# Patient Record
Sex: Male | Born: 1955 | Race: White | Hispanic: No | Marital: Married | Smoking: Never smoker
Health system: Southern US, Community
[De-identification: ages and names within clinical notes are randomized; demographics above are authoritative.]

## PROBLEM LIST (undated history)

## (undated) DIAGNOSIS — E119 Type 2 diabetes mellitus without complications: Secondary | ICD-10-CM

## (undated) DIAGNOSIS — I10 Essential (primary) hypertension: Secondary | ICD-10-CM

## (undated) HISTORY — PX: CHOLECYSTECTOMY: SHX55

## (undated) HISTORY — PX: TONSILLECTOMY: SUR1361

## (undated) HISTORY — PX: HERNIA REPAIR: SHX51

---

## 2015-11-02 DIAGNOSIS — M2042 Other hammer toe(s) (acquired), left foot: Secondary | ICD-10-CM | POA: Insufficient documentation

## 2015-11-02 DIAGNOSIS — M7742 Metatarsalgia, left foot: Secondary | ICD-10-CM | POA: Insufficient documentation

## 2015-11-02 DIAGNOSIS — E11610 Type 2 diabetes mellitus with diabetic neuropathic arthropathy: Secondary | ICD-10-CM | POA: Insufficient documentation

## 2015-11-15 DIAGNOSIS — M65872 Other synovitis and tenosynovitis, left ankle and foot: Secondary | ICD-10-CM | POA: Insufficient documentation

## 2016-01-05 DIAGNOSIS — T85848A Pain due to other internal prosthetic devices, implants and grafts, initial encounter: Secondary | ICD-10-CM | POA: Insufficient documentation

## 2016-05-13 DIAGNOSIS — E11610 Type 2 diabetes mellitus with diabetic neuropathic arthropathy: Secondary | ICD-10-CM | POA: Insufficient documentation

## 2017-06-07 ENCOUNTER — Emergency Department
Admission: EM | Admit: 2017-06-07 | Discharge: 2017-06-07 | Disposition: A | Discharge status: Home / Self Care | Payer: BLUE CROSS/BLUE SHIELD | Attending: Emergency Medicine | Admitting: Emergency Medicine

## 2017-06-07 ENCOUNTER — Encounter: Payer: Self-pay | Admitting: Emergency Medicine

## 2017-06-07 DIAGNOSIS — R112 Nausea with vomiting, unspecified: Secondary | ICD-10-CM | POA: Diagnosis present

## 2017-06-07 DIAGNOSIS — K2970 Gastritis, unspecified, without bleeding: Secondary | ICD-10-CM | POA: Diagnosis not present

## 2017-06-07 DIAGNOSIS — Z794 Long term (current) use of insulin: Secondary | ICD-10-CM | POA: Diagnosis not present

## 2017-06-07 DIAGNOSIS — Z79899 Other long term (current) drug therapy: Secondary | ICD-10-CM | POA: Diagnosis not present

## 2017-06-07 DIAGNOSIS — E119 Type 2 diabetes mellitus without complications: Secondary | ICD-10-CM | POA: Insufficient documentation

## 2017-06-07 HISTORY — DX: Essential (primary) hypertension: I10

## 2017-06-07 HISTORY — DX: Type 2 diabetes mellitus without complications: E11.9

## 2017-06-07 LAB — COMPREHENSIVE METABOLIC PANEL
ALT: 17 U/L (ref 17–63)
AST: 20 U/L (ref 15–41)
Albumin: 4 g/dL (ref 3.5–5.0)
Alkaline Phosphatase: 49 U/L (ref 38–126)
Anion gap: 10 (ref 5–15)
BUN: 29 mg/dL — AB (ref 6–20)
CHLORIDE: 105 mmol/L (ref 101–111)
CO2: 26 mmol/L (ref 22–32)
CREATININE: 0.9 mg/dL (ref 0.61–1.24)
Calcium: 9.4 mg/dL (ref 8.9–10.3)
GFR calc Af Amer: >60 mL/min (ref >60)
GFR calc non Af Amer: >60 mL/min (ref >60)
Glucose, Bld: 279 mg/dL — ABNORMAL HIGH (ref 65–99)
POTASSIUM: 4 mmol/L (ref 3.5–5.1)
SODIUM: 141 mmol/L (ref 135–145)
Total Bilirubin: 1 mg/dL (ref 0.3–1.2)
Total Protein: 7.6 g/dL (ref 6.5–8.1)

## 2017-06-07 LAB — CBC
HCT: 38.6 % — ABNORMAL LOW (ref 40.0–52.0)
Hemoglobin: 13.1 g/dL (ref 13.0–18.0)
MCH: 31.1 pg (ref 26.0–34.0)
MCHC: 34.1 g/dL (ref 32.0–36.0)
MCV: 91.3 fL (ref 80.0–100.0)
Platelets: 318 K/uL (ref 150–440)
RBC: 4.23 MIL/uL — ABNORMAL LOW (ref 4.40–5.90)
RDW: 13.9 % (ref 11.5–14.5)
WBC: 14.9 K/uL — ABNORMAL HIGH (ref 3.8–10.6)

## 2017-06-07 LAB — LIPASE, BLOOD: Lipase: 26 U/L (ref 11–51)

## 2017-06-07 MED ORDER — SODIUM CHLORIDE 0.9 % IV SOLN
1000.0000 mL | Freq: Once | INTRAVENOUS | Status: AC
  Administered 2017-06-07: 1000 mL via INTRAVENOUS

## 2017-06-07 MED ORDER — MORPHINE SULFATE (PF) 2 MG/ML IV SOLN
2.0000 mg | Freq: Once | INTRAVENOUS | Status: AC
  Administered 2017-06-07: 2 mg via INTRAVENOUS
  Filled 2017-06-07: qty 1

## 2017-06-07 MED ORDER — SODIUM CHLORIDE 0.9 % IV BOLUS (SEPSIS)
1000.0000 mL | Freq: Once | INTRAVENOUS | Status: AC
  Administered 2017-06-07: 1000 mL via INTRAVENOUS

## 2017-06-07 MED ORDER — ONDANSETRON 4 MG PO TBDP: 4.0000 mg | ORAL_TABLET | Freq: Three times a day (TID) | ORAL | 0 refills | Status: DC | PRN

## 2017-06-07 MED ORDER — ONDANSETRON HCL 4 MG/2ML IJ SOLN
4.0000 mg | Freq: Once | INTRAMUSCULAR | Status: AC
  Administered 2017-06-07: 4 mg via INTRAVENOUS
  Filled 2017-06-07: qty 2

## 2017-06-07 MED ORDER — ONDANSETRON HCL 4 MG/2ML IJ SOLN
4.0000 mg | Freq: Once | INTRAMUSCULAR | Status: AC | PRN
  Administered 2017-06-07: 4 mg via INTRAVENOUS
  Filled 2017-06-07: qty 2

## 2017-06-07 NOTE — ED Provider Notes (Signed)
[facility] Emergency Department Provider Note   ____________________________________________    I have reviewed the triage vital signs and the nursing notes.   HISTORY  Chief Complaint Emesis     HPI Randy Nelson is a 61 y.o. male who presents with nausea and vomiting. Patient reports he awoke at 1 AM this morning with upper abdominal cramping followed by nausea and vomiting. He denies diarrhea. No sick contacts reported. Known biliary non-bloody vomitus. No fevers or chills reported. No recent travel. Describes mild to moderate cramping discomfort in the upper abdomen. No history of abdominal surgery   Past Medical History:  Diagnosis Date  . Diabetes mellitus without complication (HCC)   . Hypertension     There are no active problems to display for this patient.   History reviewed. No pertinent surgical history.  Prior to Admission medications   Medication Sig Start Date End Date Taking? Authorizing Provider  amLODipine (NORVASC) 5 MG tablet Take 5 mg by mouth daily. 03/05/17  Yes [provider]  atorvastatin (LIPITOR) 10 MG tablet Take 10 mg by mouth daily. 04/14/17  Yes [provider]  chlorthalidone (HYGROTON) 25 MG tablet Take 25 mg by mouth daily. 06/03/17  Yes [provider]  HUMALOG KWIKPEN 200 UNIT/ML SOPN Inject 5-30 Units into the skin 3 (three) times daily with meals as needed. 03/31/17  Yes [provider]  LEVEMIR FLEXTOUCH 100 UNIT/ML Pen Inject 50 Units into the skin 2 (two) times daily. 05/28/17  Yes [provider]  lisinopril (PRINIVIL,ZESTRIL) 40 MG tablet Take 40 mg by mouth daily. 04/07/17  Yes [provider]  metFORMIN (GLUCOPHAGE) 500 MG tablet Take 1,000 mg by mouth 2 (two) times daily. 04/14/17  Yes [provider]  ondansetron (ZOFRAN ODT) 4 MG disintegrating tablet Take 1 tablet (4 mg total) by mouth every 8 (eight) hours as needed for nausea or  vomiting. 06/07/17   Jene EveryKinner, Catcher Dehoyos, MD     Allergies Sulfa antibiotics  History reviewed. No pertinent family history.  Social History Social History  Substance Use Topics  . Smoking status: Never Smoker  . Smokeless tobacco: Never Used  . Alcohol use No    Review of Systems  Constitutional: No fever/chills Eyes: No visual changes.  ENT: No sore throat. Cardiovascular: Denies chest pain. Respiratory: Denies shortness of breath. Gastrointestinal: As above Genitourinary: Negative for dysuria. Musculoskeletal: Negative for back pain. Skin: Negative for rash. Neurological: Negative for headaches    ____________________________________________   PHYSICAL EXAM:  VITAL SIGNS: ED Triage Vitals [06/07/17 0539]  Enc Vitals Group     BP (!) 172/88     Pulse Rate 63     Resp 16     Temp 97.7 F (36.5 C)     Temp Source Oral     SpO2 98 %     Weight 111.1 kg (245 lb)     Height 1.905 m (6\' 3" )     Head Circumference      Peak Flow      Pain Score      Pain Loc      Pain Edu?      Excl. in GC?     Constitutional: Alert and oriented. No acute distress. Pleasant and interactive Eyes: Conjunctivae are normal.   Nose: No congestion/rhinnorhea. Mouth/Throat: Mucous membranes are moist.    Cardiovascular: Normal rate, regular rhythm. Grossly normal heart sounds.  Good peripheral circulation. Respiratory: Normal respiratory effort.  No retractions. Lungs CTAB.  Gastrointestinal: Soft and nontender. No distention.  No CVA tenderness. Genitourinary: deferred Musculoskeletal: No lower extremity tenderness nor edema.  Warm and well perfused Neurologic:  Normal speech and language. No gross focal neurologic deficits are appreciated.  Skin:  Skin is warm, dry and intact. No rash noted. Psychiatric: Mood and affect are normal. Speech and behavior are normal.  ____________________________________________   LABS (all labs ordered are listed, but only abnormal results are  displayed)  Labs Reviewed  COMPREHENSIVE METABOLIC PANEL - Abnormal; Notable for the following:       Result Value   Glucose, Bld 279 (*)    BUN 29 (*)    All other components within normal limits  CBC - Abnormal; Notable for the following:    WBC 14.9 (*)    RBC 4.23 (*)    HCT 38.6 (*)    All other components within normal limits  LIPASE, BLOOD  URINALYSIS, COMPLETE (UACMP) WITH MICROSCOPIC  CBG MONITORING, ED   ____________________________________________  EKG  ED ECG REPORT I, Jene Every, the attending physician, personally viewed and interpreted this ECG.  Date: 06/07/2017  Rhythm: Sinus arrhythmia QRS Axis: normal Intervals: normal ST/T Wave abnormalities: normal Narrative Interpretation: no evidence of acute ischemia  ____________________________________________  RADIOLOGY  None ____________________________________________   PROCEDURES  Procedure(s) performed: No    Critical Care performed: No ____________________________________________   INITIAL IMPRESSION / ASSESSMENT AND PLAN / ED COURSE  Pertinent labs & imaging results that were available during my care of the patient were reviewed by me and considered in my medical decision making (see chart for details).  Patient well-appearing and in no acute distress. No significant tenderness palpation on exam. Labs significant only for elevated white blood cell, I suspect this is related to viral gastritis that is prevalent in the community at this time. We will treat with IV Zofran and IV fluids and reevaluate.  Patient had brief episode of chest discomfort after morphine admin. EKG unremarkable  Patient has tolerated PO challenge and reports feeling better.   I will discharge with zofran. Return precautions discussed.    ____________________________________________   FINAL CLINICAL IMPRESSION(S) / ED DIAGNOSES  Final diagnoses:  Viral gastritis      NEW MEDICATIONS STARTED DURING THIS  VISIT:  Discharge Medication List as of 06/07/2017 10:41 AM    START taking these medications   Details  ondansetron (ZOFRAN ODT) 4 MG disintegrating tablet Take 1 tablet (4 mg total) by mouth every 8 (eight) hours as needed for nausea or vomiting., Starting Sat 06/07/2017, Print         Note:  This document was prepared using Dragon voice recognition software and may include unintentional dictation errors.    Jene Every, MD 06/07/17 1341

## 2017-06-07 NOTE — ED Notes (Signed)
Report to alicia, rn.  

## 2017-06-07 NOTE — ED Triage Notes (Signed)
Pt c/o N/V and stomach cramping since 0100 this AM. Pt has hx of DM, CBG 252 in triage.

## 2017-06-07 NOTE — ED Notes (Signed)

## 2017-06-07 NOTE — ED Notes (Signed)
States chest pian 5/10 for past 10 minutes. EKG done and to MD.

## 2017-06-07 NOTE — ED Notes (Signed)
Pt states awoke at 0100 with bilateral lower abd pain and cramping followed by nausea and vomiting. Pt states last bowel movement during emesis, but no diarrhea. Pt states he and his spouse consumed pork chops pm prior to onset of symptoms, however spouse is not ill. Pt denies known fever. Skin normal color, warm and dry.

## 2017-06-10 ENCOUNTER — Inpatient Hospital Stay
Admission: EM | Admit: 2017-06-10 | Discharge: 2017-06-15 | DRG: 871 | Disposition: A | Discharge status: Other Acute Inpatient Hospital | Payer: BLUE CROSS/BLUE SHIELD | Attending: Internal Medicine | Admitting: Internal Medicine

## 2017-06-10 ENCOUNTER — Emergency Department: Payer: BLUE CROSS/BLUE SHIELD

## 2017-06-10 DIAGNOSIS — K819 Cholecystitis, unspecified: Secondary | ICD-10-CM | POA: Diagnosis present

## 2017-06-10 DIAGNOSIS — K8050 Calculus of bile duct without cholangitis or cholecystitis without obstruction: Secondary | ICD-10-CM | POA: Diagnosis not present

## 2017-06-10 DIAGNOSIS — R7881 Bacteremia: Secondary | ICD-10-CM

## 2017-06-10 DIAGNOSIS — Z794 Long term (current) use of insulin: Secondary | ICD-10-CM

## 2017-06-10 DIAGNOSIS — K859 Acute pancreatitis without necrosis or infection, unspecified: Secondary | ICD-10-CM | POA: Diagnosis not present

## 2017-06-10 DIAGNOSIS — Z7982 Long term (current) use of aspirin: Secondary | ICD-10-CM

## 2017-06-10 DIAGNOSIS — R17 Unspecified jaundice: Secondary | ICD-10-CM

## 2017-06-10 DIAGNOSIS — E669 Obesity, unspecified: Secondary | ICD-10-CM | POA: Diagnosis present

## 2017-06-10 DIAGNOSIS — I248 Other forms of acute ischemic heart disease: Secondary | ICD-10-CM | POA: Diagnosis present

## 2017-06-10 DIAGNOSIS — Z79899 Other long term (current) drug therapy: Secondary | ICD-10-CM | POA: Diagnosis not present

## 2017-06-10 DIAGNOSIS — R1010 Upper abdominal pain, unspecified: Secondary | ICD-10-CM

## 2017-06-10 DIAGNOSIS — E1122 Type 2 diabetes mellitus with diabetic chronic kidney disease: Secondary | ICD-10-CM | POA: Diagnosis present

## 2017-06-10 DIAGNOSIS — R740 Nonspecific elevation of levels of transaminase and lactic acid dehydrogenase [LDH]: Secondary | ICD-10-CM | POA: Diagnosis not present

## 2017-06-10 DIAGNOSIS — E876 Hypokalemia: Secondary | ICD-10-CM | POA: Diagnosis not present

## 2017-06-10 DIAGNOSIS — N183 Chronic kidney disease, stage 3 (moderate): Secondary | ICD-10-CM | POA: Diagnosis present

## 2017-06-10 DIAGNOSIS — A4159 Other Gram-negative sepsis: Principal | ICD-10-CM | POA: Diagnosis present

## 2017-06-10 DIAGNOSIS — A419 Sepsis, unspecified organism: Secondary | ICD-10-CM

## 2017-06-10 DIAGNOSIS — K567 Ileus, unspecified: Secondary | ICD-10-CM | POA: Diagnosis not present

## 2017-06-10 DIAGNOSIS — E1165 Type 2 diabetes mellitus with hyperglycemia: Secondary | ICD-10-CM | POA: Diagnosis present

## 2017-06-10 DIAGNOSIS — Z6830 Body mass index (BMI) 30.0-30.9, adult: Secondary | ICD-10-CM

## 2017-06-10 DIAGNOSIS — I129 Hypertensive chronic kidney disease with stage 1 through stage 4 chronic kidney disease, or unspecified chronic kidney disease: Secondary | ICD-10-CM | POA: Diagnosis present

## 2017-06-10 DIAGNOSIS — K8063 Calculus of gallbladder and bile duct with acute cholecystitis with obstruction: Secondary | ICD-10-CM | POA: Diagnosis present

## 2017-06-10 DIAGNOSIS — N179 Acute kidney failure, unspecified: Secondary | ICD-10-CM | POA: Diagnosis present

## 2017-06-10 DIAGNOSIS — K8031 Calculus of bile duct with cholangitis, unspecified, with obstruction: Secondary | ICD-10-CM

## 2017-06-10 DIAGNOSIS — R112 Nausea with vomiting, unspecified: Secondary | ICD-10-CM

## 2017-06-10 DIAGNOSIS — R7401 Elevation of levels of liver transaminase levels: Secondary | ICD-10-CM

## 2017-06-10 LAB — TROPONIN I
Troponin I: 0.06 ng/mL (ref <0.03)
Troponin I: 0.23 ng/mL (ref <0.03)

## 2017-06-10 LAB — URINALYSIS, COMPLETE (UACMP) WITH MICROSCOPIC
Bilirubin Urine: NEGATIVE
Glucose, UA: 50 mg/dL — AB
Ketones, ur: NEGATIVE mg/dL
Leukocytes, UA: NEGATIVE
NITRITE: NEGATIVE
PH: 5 (ref 5.0–8.0)
Protein, ur: 100 mg/dL — AB
SPECIFIC GRAVITY, URINE: 1.026 (ref 1.005–1.030)

## 2017-06-10 LAB — COMPREHENSIVE METABOLIC PANEL
ALBUMIN: 2.9 g/dL — AB (ref 3.5–5.0)
ALK PHOS: 362 U/L — AB (ref 38–126)
ALT: 97 U/L — ABNORMAL HIGH (ref 17–63)
ANION GAP: 13 (ref 5–15)
AST: 228 U/L — ABNORMAL HIGH (ref 15–41)
BILIRUBIN TOTAL: 3.4 mg/dL — AB (ref 0.3–1.2)
BUN: 23 mg/dL — ABNORMAL HIGH (ref 6–20)
CALCIUM: 8.3 mg/dL — AB (ref 8.9–10.3)
CO2: 23 mmol/L (ref 22–32)
Chloride: 100 mmol/L — ABNORMAL LOW (ref 101–111)
Creatinine, Ser: 1.13 mg/dL (ref 0.61–1.24)
GFR calc Af Amer: >60 mL/min (ref >60)
GLUCOSE: 221 mg/dL — AB (ref 65–99)
Potassium: 3.1 mmol/L — ABNORMAL LOW (ref 3.5–5.1)
Sodium: 136 mmol/L (ref 135–145)
TOTAL PROTEIN: 6.9 g/dL (ref 6.5–8.1)

## 2017-06-10 LAB — CBC WITH DIFFERENTIAL/PLATELET
BASOS ABS: 0 10*3/uL (ref 0–0.1)
BASOS PCT: 0 %
EOS PCT: 0 %
Eosinophils Absolute: 0 10*3/uL (ref 0–0.7)
HEMATOCRIT: 34 % — AB (ref 40.0–52.0)
Hemoglobin: 11.7 g/dL — ABNORMAL LOW (ref 13.0–18.0)
Lymphocytes Relative: 2 %
Lymphs Abs: 0.3 10*3/uL — ABNORMAL LOW (ref 1.0–3.6)
MCH: 30.8 pg (ref 26.0–34.0)
MCHC: 34.5 g/dL (ref 32.0–36.0)
MCV: 89.1 fL (ref 80.0–100.0)
MONO ABS: 0.3 10*3/uL (ref 0.2–1.0)
Monocytes Relative: 3 %
NEUTROS ABS: 11.6 10*3/uL — AB (ref 1.4–6.5)
Neutrophils Relative %: 95 %
PLATELETS: 310 10*3/uL (ref 150–440)
RBC: 3.81 MIL/uL — ABNORMAL LOW (ref 4.40–5.90)
RDW: 13.6 % (ref 11.5–14.5)
WBC: 12.2 10*3/uL — ABNORMAL HIGH (ref 3.8–10.6)

## 2017-06-10 LAB — LACTIC ACID, PLASMA: Lactic Acid, Venous: 1.8 mmol/L (ref 0.5–1.9)

## 2017-06-10 LAB — GLUCOSE, CAPILLARY: GLUCOSE-CAPILLARY: 274 mg/dL — AB (ref 65–99)

## 2017-06-10 MED ORDER — ACETAMINOPHEN 325 MG PO TABS
650.0000 mg | ORAL_TABLET | Freq: Four times a day (QID) | ORAL | Status: DC | PRN
  Administered 2017-06-10 – 2017-06-12 (×5): 650 mg via ORAL
  Filled 2017-06-10 (×5): qty 2

## 2017-06-10 MED ORDER — ONDANSETRON HCL 4 MG/2ML IJ SOLN
4.0000 mg | Freq: Four times a day (QID) | INTRAMUSCULAR | Status: DC | PRN
  Administered 2017-06-12 – 2017-06-15 (×3): 4 mg via INTRAVENOUS
  Filled 2017-06-10 (×3): qty 2

## 2017-06-10 MED ORDER — SODIUM CHLORIDE 0.9 % IV BOLUS (SEPSIS): 1000.0000 mL | Freq: Once | INTRAVENOUS | Status: DC

## 2017-06-10 MED ORDER — SODIUM CHLORIDE 0.9 % IV BOLUS (SEPSIS)
1000.0000 mL | Freq: Once | INTRAVENOUS | Status: AC
  Administered 2017-06-10: 1000 mL via INTRAVENOUS

## 2017-06-10 MED ORDER — ONDANSETRON HCL 4 MG/2ML IJ SOLN
4.0000 mg | Freq: Once | INTRAMUSCULAR | Status: AC
  Administered 2017-06-10: 4 mg via INTRAVENOUS
  Filled 2017-06-10: qty 2

## 2017-06-10 MED ORDER — PIPERACILLIN-TAZOBACTAM 3.375 G IVPB
3.3750 g | Freq: Three times a day (TID) | INTRAVENOUS | Status: DC
  Administered 2017-06-11 – 2017-06-13 (×8): 3.375 g via INTRAVENOUS
  Filled 2017-06-10 (×11): qty 50

## 2017-06-10 MED ORDER — ALBUTEROL SULFATE (2.5 MG/3ML) 0.083% IN NEBU: 2.5000 mg | INHALATION_SOLUTION | RESPIRATORY_TRACT | Status: DC | PRN

## 2017-06-10 MED ORDER — ENOXAPARIN SODIUM 40 MG/0.4ML ~~LOC~~ SOLN
40.0000 mg | SUBCUTANEOUS | Status: DC
  Administered 2017-06-10: 40 mg via SUBCUTANEOUS
  Filled 2017-06-10: qty 0.4

## 2017-06-10 MED ORDER — PIPERACILLIN-TAZOBACTAM 3.375 G IVPB 30 MIN
3.3750 g | Freq: Once | INTRAVENOUS | Status: AC
  Administered 2017-06-10: 3.375 g via INTRAVENOUS
  Filled 2017-06-10: qty 50

## 2017-06-10 MED ORDER — INSULIN ASPART 100 UNIT/ML ~~LOC~~ SOLN
0.0000 [IU] | SUBCUTANEOUS | Status: DC
  Administered 2017-06-10 – 2017-06-11 (×2): 8 [IU] via SUBCUTANEOUS
  Administered 2017-06-11 (×4): 3 [IU] via SUBCUTANEOUS
  Filled 2017-06-10 (×6): qty 1

## 2017-06-10 MED ORDER — SODIUM CHLORIDE 0.9% FLUSH
3.0000 mL | Freq: Two times a day (BID) | INTRAVENOUS | Status: DC
  Administered 2017-06-11 – 2017-06-15 (×6): 3 mL via INTRAVENOUS

## 2017-06-10 MED ORDER — OXYCODONE HCL 5 MG PO TABS
5.0000 mg | ORAL_TABLET | ORAL | Status: DC | PRN
  Administered 2017-06-12 – 2017-06-15 (×6): 5 mg via ORAL
  Filled 2017-06-10 (×6): qty 1

## 2017-06-10 MED ORDER — IOPAMIDOL (ISOVUE-300) INJECTION 61%
30.0000 mL | Freq: Once | INTRAVENOUS | Status: AC | PRN
  Administered 2017-06-10: 30 mL via ORAL

## 2017-06-10 MED ORDER — INSULIN DETEMIR 100 UNIT/ML ~~LOC~~ SOLN
15.0000 [IU] | Freq: Two times a day (BID) | SUBCUTANEOUS | Status: DC
  Administered 2017-06-10 – 2017-06-11 (×2): 15 [IU] via SUBCUTANEOUS
  Filled 2017-06-10 (×3): qty 0.15

## 2017-06-10 MED ORDER — MORPHINE SULFATE (PF) 2 MG/ML IV SOLN: 2.0000 mg | INTRAVENOUS | Status: DC | PRN

## 2017-06-10 MED ORDER — POLYETHYLENE GLYCOL 3350 17 G PO PACK: 17.0000 g | PACK | Freq: Every day | ORAL | Status: DC | PRN

## 2017-06-10 MED ORDER — POTASSIUM CHLORIDE IN NACL 20-0.9 MEQ/L-% IV SOLN
INTRAVENOUS | Status: DC
  Administered 2017-06-10 – 2017-06-14 (×9): via INTRAVENOUS
  Filled 2017-06-10 (×11): qty 1000

## 2017-06-10 MED ORDER — ONDANSETRON HCL 4 MG PO TABS
4.0000 mg | ORAL_TABLET | Freq: Four times a day (QID) | ORAL | Status: DC | PRN
  Administered 2017-06-13: 4 mg via ORAL
  Filled 2017-06-10: qty 1

## 2017-06-10 MED ORDER — ACETAMINOPHEN 650 MG RE SUPP: 650.0000 mg | Freq: Four times a day (QID) | RECTAL | Status: DC | PRN

## 2017-06-10 NOTE — ED Notes (Signed)
Troponin of 0.06 reported to Dr Pershing ProudSchaevitz - no new orders at this time

## 2017-06-10 NOTE — Consult Note (Signed)
Patient ID: Randy Nelson, male   DOB: [birth year], 61 y.o.   MRN: [id number]  CC: nausea and vomiting  HPI Randy Nelson is a 61 y.o. male who presents to the emergency department with complaints of nausea, vomiting, fevers. Surgery consultation was requested by Dr.Shaevitz due to concerns for cholecystitis. Patient had been seen in the ER 4 days ago with similar complaints of nausea, vomiting, fevers. He was diagnosed with a viral gastritis and discharged home. He reports since that time his symptoms have persisted and worsened today prompting him to return to the emergency room. He's had a fever as high as 102F and has had persistent chills and also reports having some shortness of breath. He denies any chest pain, diarrhea, constipation, dysuria. Initially he reported no abdominal pain. He's never had any symptoms like this before. Nothing appears to making a better or worse.  HPI  Past Medical History:  Diagnosis Date  . Diabetes mellitus without complication (HCC)   . Hypertension     Past surgical history: Umbilical hernia repair, tonsillectomy, foot surgery.  Family History  Problem Relation Age of Onset  . Diabetes Father   . Diabetes Brother   Brother also with a collagen disease, mother with cancer of unknown type.  Social History Social History  Substance Use Topics  . Smoking status: Never Smoker  . Smokeless tobacco: Never Used  . Alcohol use No    Allergies  Allergen Reactions  . Sulfa Antibiotics Other (See Comments)    Family history of allergy, mostly rashes. No anaphylaxis reported.    Current Facility-Administered Medications  Medication Dose Route Frequency Provider Last Rate Last Dose  . 0.9 % NaCl with KCl 20 mEq/ L  infusion   Intravenous Continuous Sudini, Srikar, MD      . acetaminophen (TYLENOL) tablet 650 mg  650 mg Oral Q6H PRN Milagros Loll, MD       Or  . acetaminophen (TYLENOL) suppository 650 mg  650 mg Rectal Q6H PRN Sudini,  Srikar, MD      . albuterol (PROVENTIL) (2.5 MG/3ML) 0.083% nebulizer solution 2.5 mg  2.5 mg Nebulization Q2H PRN Sudini, Srikar, MD      . enoxaparin (LOVENOX) injection 40 mg  40 mg Subcutaneous Q24H Sudini, Srikar, MD      . insulin aspart (novoLOG) injection 0-15 Units  0-15 Units Subcutaneous Q4H Sudini, Srikar, MD      . insulin detemir (LEVEMIR) injection 15 Units  15 Units Subcutaneous BID Sudini, Srikar, MD      . ondansetron (ZOFRAN) tablet 4 mg  4 mg Oral Q6H PRN Milagros Loll, MD       Or  . ondansetron (ZOFRAN) injection 4 mg  4 mg Intravenous Q6H PRN Sudini, Srikar, MD      . oxyCODONE (Oxy IR/ROXICODONE) immediate release tablet 5 mg  5 mg Oral Q4H PRN Milagros Loll, MD      . Melene Muller ON 06/11/2017] piperacillin-tazobactam (ZOSYN) IVPB 3.375 g  3.375 g Intravenous Q8H Coffee, Gerre Pebbles, RPH      . polyethylene glycol (MIRALAX / GLYCOLAX) packet 17 g  17 g Oral Daily PRN Sudini, Srikar, MD      . sodium chloride 0.9 % bolus 1,000 mL  1,000 mL Intravenous Once Sudini, Srikar, MD      . sodium chloride flush (NS) 0.9 % injection 3 mL  3 mL Intravenous Q12H Milagros Loll, MD       Current Outpatient Prescriptions  Medication Sig Dispense Refill  .  amLODipine (NORVASC) 5 MG tablet Take 5 mg by mouth daily.    Marland Kitchen aspirin EC 81 MG tablet Take 81 mg by mouth daily.    Marland Kitchen atorvastatin (LIPITOR) 10 MG tablet Take 10 mg by mouth daily.    . chlorthalidone (HYGROTON) 25 MG tablet Take 25 mg by mouth daily.    Marland Kitchen LEVEMIR FLEXTOUCH 100 UNIT/ML Pen Inject 50 Units into the skin 2 (two) times daily.    Marland Kitchen lisinopril (PRINIVIL,ZESTRIL) 40 MG tablet Take 40 mg by mouth daily.    . metFORMIN (GLUCOPHAGE) 500 MG tablet Take 1,000 mg by mouth 2 (two) times daily.    Marland Kitchen HUMALOG KWIKPEN 200 UNIT/ML SOPN Inject 5-30 Units into the skin 3 (three) times daily with meals as needed.    . ondansetron (ZOFRAN ODT) 4 MG disintegrating tablet Take 1 tablet (4 mg total) by mouth every 8 (eight) hours as needed for  nausea or vomiting. 20 tablet 0     Review of Systems A Multi-point review of systems was asked and was negative except for the findings documented in the history of present illness  Physical Exam Blood pressure (!) 119/48, pulse 79, temperature (!) 102.4 F (39.1 C), temperature source Oral, resp. rate 17, height 6\' 3"  (1.905 m), weight 111.1 kg (245 lb), SpO2 96 %. CONSTITUTIONAL: Resting in bed in no acute distress. EYES: Pupils are equal, round, and reactive to light, Sclera appeared be jaundiced. EARS, NOSE, MOUTH AND THROAT: The oropharynx is clear. The oral mucosa is pink and moist. Hearing is intact to voice. LYMPH NODES:  Lymph nodes in the neck are normal. RESPIRATORY:  Lungs are clear. There is normal respiratory effort, with equal breath sounds bilaterally, and without pathologic use of accessory muscles. CARDIOVASCULAR: Heart is regular without murmurs, gallops, or rubs. GI: The abdomen is large, soft, minimally tender to deep palpation in the right upper quadrant with a negative Murphy sign, and nondistended. There are no palpable masses. There is no hepatosplenomegaly. There are normal bowel sounds in all quadrants. GU: Rectal deferred.   MUSCULOSKELETAL: Normal muscle strength and tone. No cyanosis or edema.   SKIN: Turgor is good and there are no pathologic skin lesions or ulcers. NEUROLOGIC: Motor and sensation is grossly normal. Cranial nerves are grossly intact. PSYCH:  Oriented to person, place and time. Affect is normal.  Data Reviewed Images and labs reviewed, there is a mild leukocytosis of 12.2 down from 14.9 four days ago. Elevated bilirubin of 3.4 that was normal on previous encounter. Elevated alkaline phosphatase to 362, AST of 228, ALT of 97. Bacteria seen on urinalysis but no other laboratory signs of urinary tract infection. Ultrasound the right upper quadrant shows a distended gallbladder with evidence of cholelithiasis but no pericholecystic fluid, ductal  dilatation, sonographic Murphy sign. Patient also has an elevated troponin to 0.6. Chest x-ray shows possible evidence of low-grade CHF with central pulmonary vascular prominence. I have personally reviewed the patient's imaging, laboratory findings and medical records.    Assessment     Likely cholecystitis    Plan    61 year old male who 3 presents emergency department with fevers chills, nausea, vomiting. This is clearly different from his presentation 4 days ago. Thus far the only positive finding found on workup was of cholelithiasis and given his fever and leukocytosis could potentially represent cholecystitis. However, the history and physical was not 100% consistent with this diagnosis. Regardless, discussed with the patient given his current presentation that he warrants admission to the  hospital for resuscitation and continued workup. Assuming persistent elevation of his bilirubin would recommend an MRCP as there is no ductal dilatation seen on his ultrasound. This case was discussed with the ER provider and the current hospitalist on-call. Current plan will be for patient to the hospitalist services for continued resuscitation and workup of his likely cholecystitis. Discussed timing of surgical intervention with the patient should it ultimately turned out to be cholecystitis. General surgery will follow along during this admission.     Time spent with the patient was 50 minutes, with more than 50% of the time spent in face-to-face education, counseling and care coordination.     Ricarda Frameharles Lucillia Corson, MD FACS General Surgeon 06/10/2017, 8:21 PM

## 2017-06-10 NOTE — ED Provider Notes (Addendum)
[facility] Emergency Department Provider Note  ____________________________________________   First MD Initiated Contact with Patient 06/10/17 1539     (approximate)  I have reviewed the triage vital signs and the nursing notes.   HISTORY  Chief Complaint Fever and Nausea   HPI Randy Nelson is a 61 y.o. male with a history of diabetes and hypertension who is presenting to the emergency department today with upper abdominal pain associated with chills, nausea and vomiting. He denies any diarrhea. He says that the pain is mild at this time and is intermittent. He says that the thing bothering him and this time the most is his inability to sleep as well as right years, repeatedly at home. He was brought in by EMS today and found him to have a temperature of 103.  He denies any known sick contacts. Denies any blood in his vomitus. Also with a decreased appetite. Said that he had chest pain several days ago after morphine administration but has not had any since. EMS also found the patient to be 90% on room air. He does not use home oxygen. He was placed on 2 L of nasal cannula oxygen which resolved his oxygen saturation to 93-94%.Patient denies any cough or runny nose.    Past Medical History:  Diagnosis Date  . Diabetes mellitus without complication (HCC)   . Hypertension     There are no active problems to display for this patient.   History reviewed. No pertinent surgical history.  Prior to Admission medications   Medication Sig Start Date End Date Taking? Authorizing Provider  amLODipine (NORVASC) 5 MG tablet Take 5 mg by mouth daily. 03/05/17  Yes [provider]  aspirin EC 81 MG tablet Take 81 mg by mouth daily.   Yes [provider]  atorvastatin (LIPITOR) 10 MG tablet Take 10 mg by mouth daily. 04/14/17  Yes [provider]  chlorthalidone (HYGROTON) 25 MG tablet Take 25 mg by mouth daily. 06/03/17  Yes [provider]  LEVEMIR FLEXTOUCH 100 UNIT/ML Pen Inject 50 Units into the skin 2 (two) times daily. 05/28/17  Yes [provider]  lisinopril (PRINIVIL,ZESTRIL) 40 MG tablet Take 40 mg by mouth daily. 04/07/17  Yes [provider]  metFORMIN (GLUCOPHAGE) 500 MG tablet Take 1,000 mg by mouth 2 (two) times daily. 04/14/17  Yes [provider]  HUMALOG KWIKPEN 200 UNIT/ML SOPN Inject 5-30 Units into the skin 3 (three) times daily with meals as needed. 03/31/17   [provider]  ondansetron (ZOFRAN ODT) 4 MG disintegrating tablet Take 1 tablet (4 mg total) by mouth every 8 (eight) hours as needed for nausea or vomiting. 06/07/17   Jene Every, MD    Allergies Sulfa antibiotics  No family history on file.  Social History Social History  Substance Use Topics  . Smoking status: Never Smoker  . Smokeless tobacco: Never Used  . Alcohol use No    Review of Systems  Constitutional: fever/chills Eyes: No visual changes. ENT: No sore throat. Cardiovascular: Denies chest pain. Respiratory: Denies shortness of breath. Gastrointestinal: No diarrhea.  No constipation. Genitourinary: Negative for dysuria. Musculoskeletal: Negative for back pain. Skin: Negative for rash. Neurological: Negative for headaches, focal weakness or numbness.   ____________________________________________   PHYSICAL EXAM:  VITAL SIGNS: ED Triage Vitals  Enc Vitals Group     BP 06/10/17 1600 (!) 109/44     Pulse Rate 06/10/17 1600 96     Resp 06/10/17 1600  17     Temp 06/10/17 1600 (!) 102.4 F (39.1 C)     Temp Source 06/10/17 1600 Oral     SpO2 06/10/17 1555 90 %     Weight 06/10/17 1559 245 lb (111.1 kg)     Height 06/10/17 1559 6\' 3"  (1.905 m)     Head Circumference --      Peak Flow --      Pain Score 06/10/17 1558 0     Pain Loc --      Pain Edu? --      Excl. in GC? --     Constitutional: Alert and oriented. Well appearing and in no acute distress. Eyes:  Conjunctivae are normal.  Head: Atraumatic. Nose: No congestion/rhinnorhea. Mouth/Throat: Mucous membranes are moist.  Neck: No stridor.   Cardiovascular: Normal rate, regular rhythm. Grossly normal heart sounds.   Respiratory: Normal respiratory effort.  No retractions. Lungs CTAB. Gastrointestinal: Soft and nontender. No distention. No CVA tenderness. Musculoskeletal: No lower extremity tenderness nor edema.  No joint effusions. Neurologic:  Normal speech and language. No gross focal neurologic deficits are appreciated. Skin:  Skin is warm, dry and intact. No rash noted. Psychiatric: Mood and affect are normal. Speech and behavior are normal.  ____________________________________________   LABS (all labs ordered are listed, but only abnormal results are displayed)  Labs Reviewed  COMPREHENSIVE METABOLIC PANEL - Abnormal; Notable for the following:       Result Value   Potassium 3.1 (*)    Chloride 100 (*)    Glucose, Bld 221 (*)    BUN 23 (*)    Calcium 8.3 (*)    Albumin 2.9 (*)    AST 228 (*)    ALT 97 (*)    Alkaline Phosphatase 362 (*)    Total Bilirubin 3.4 (*)    All other components within normal limits  CBC WITH DIFFERENTIAL/PLATELET - Abnormal; Notable for the following:    WBC 12.2 (*)    RBC 3.81 (*)    Hemoglobin 11.7 (*)    HCT 34.0 (*)    Neutro Abs 11.6 (*)    Lymphs Abs 0.3 (*)    All other components within normal limits  URINALYSIS, COMPLETE (UACMP) WITH MICROSCOPIC - Abnormal; Notable for the following:    Color, Urine AMBER (*)    APPearance CLOUDY (*)    Glucose, UA 50 (*)    Hgb urine dipstick SMALL (*)    Protein, ur 100 (*)    Bacteria, UA FEW (*)    Squamous Epithelial / LPF 0-5 (*)    All other components within normal limits  TROPONIN I - Abnormal; Notable for the following:    Troponin I 0.06 (*)    All other components within normal limits  CULTURE, BLOOD (ROUTINE X 2)  CULTURE, BLOOD (ROUTINE X 2)  URINE CULTURE  LACTIC ACID,  PLASMA   ____________________________________________  EKG  ED ECG REPORT I, Cyana Shook,  Teena Iraniavid M, the attending physician, personally viewed and interpreted this ECG.   Date: 06/10/2017  EKG Time: 1616  Rate: 91  Rhythm: normal sinus rhythm  Axis: Normal  Intervals:none  ST&T Change: No ST segment elevation or depression. No abnormal T-wave inversion.  ____________________________________________  RADIOLOGY  Chest x-ray with mild interstitial prominence suggesting bronchitic change. Possible low-grade CHF as well.  Ultrasound of the abdomen with possible cholecystitis. ____________________________________________   PROCEDURES  Procedure(s) performed:   Procedures  Critical Care performed:   ____________________________________________   INITIAL  IMPRESSION / ASSESSMENT AND PLAN / ED COURSE  Pertinent labs & imaging results that were available during my care of the patient were reviewed by me and considered in my medical decision making (see chart for details).  ----------------------------------------- 7:41 PM on 06/10/2017 -----------------------------------------  Patient evaluated by Dr. Tonita Cong of the surgical service who believes that the patient's labwork is likely secondary to sepsis and the patient does require surgery at this time. He'll be admitted to the medicine service. We will continue his resuscitation. Patient is understanding the plan and willing to comply.      ____________________________________________   FINAL CLINICAL IMPRESSION(S) / ED DIAGNOSES  Sepsis. Abdominal pain. Nausea vomiting. Hyperbilirubinemia. Transaminitis.    NEW MEDICATIONS STARTED DURING THIS VISIT:  New Prescriptions   No medications on file     Note:  This document was prepared using Dragon voice recognition software and may include unintentional dictation errors.     Myrna Blazer, MD 06/10/17 1944    Myrna Blazer, MD 06/10/17  [phone]

## 2017-06-10 NOTE — ED Triage Notes (Signed)
Pt arrived via ems for c/o fever with chills and nausea/vomiting - pt was seen in the ER Sept 1st and since then has not improved - denies shortness of breath - denies chest pain - denies any pain

## 2017-06-10 NOTE — Progress Notes (Signed)
Pharmacy Antibiotic Note  Twanna HyLeslie Irven Schreck is a 61 y.o. male admitted on 06/10/2017 with sepsis.  Pharmacy has been consulted for zosyn dosing.  Plan: Zosyn 3.375g IV q8h (4 hour infusion).  Height: 6\' 3"  (190.5 cm) Weight: 245 lb (111.1 kg) IBW/kg (Calculated) : 84.5  Temp (24hrs), Avg:102.4 F (39.1 C), Min:102.4 F (39.1 C), Max:102.4 F (39.1 C)   Recent Labs Lab 06/07/17 0535 06/10/17 1614  WBC 14.9* 12.2*  CREATININE 0.90 1.13  LATICACIDVEN  --  1.8    Estimated Creatinine Clearance: 90 mL/min (by C-G formula based on SCr of 1.13 mg/dL).    Allergies  Allergen Reactions  . Sulfa Antibiotics Other (See Comments)    Family history of allergy, mostly rashes. No anaphylaxis reported.    Antimicrobials this admission: 9/4 zosyn >>    Microbiology results: 9/4 BCx: Pending x 2  9/4 UCx: Pending   Thank you for allowing pharmacy to be a part of this patient's care.  Gerre PebblesGarrett Kadeshia Kasparian 06/10/2017 6:02 PM

## 2017-06-10 NOTE — ED Notes (Addendum)
Patient transported to x-ray. ?

## 2017-06-10 NOTE — ED Notes (Signed)
CODE SEPSIS CALLED TO D[facility]G AT [facility] 

## 2017-06-10 NOTE — ED Notes (Signed)
Patient transported to US at this time.  

## 2017-06-10 NOTE — Progress Notes (Signed)
Spoke with Dr.Huglemeyer about patient's request  To not do CBG's every 4 hrs, no change in order at this time. Also asked if patient could shower in the am, no new order at this time, Dr.Huglemeyer stated it could be revisited in the am. Patient also requested his 50 units of levemir bid that he takes at home, no new orders at this time due to patient being NPO. Randy Nelson,Randy Nelson M

## 2017-06-10 NOTE — H&P (Addendum)
[facility] - [place] [facility]   PATIENT NAME: Randy Nelson    MR#:  [id number]  DATE OF BIRTH:  08-09-1956  DATE OF ADMISSION:  06/10/2017  PRIMARY CARE PHYSICIAN: Eulogio BearManor, James P., MD   REQUESTING/REFERRING PHYSICIAN: Dr. Langston MaskerShaevitz  CHIEF COMPLAINT:   Chief Complaint  Patient presents with  . Fever  . Nausea    HISTORY OF PRESENT ILLNESS:  Randy ReiningLeslie Langston  is a 61 y.o. male with a known history of Hypertension, diabetes presents to the emergency room with 4 days of nausea, vomiting and abdominal pain. Patient was seen in the emergency room on 06/07/2017 diagnosed with viral gastroenteritis and discharged home. Patient continued to have symptoms along with chills, fever and return to the emergency room today. Here patient has been found to have sepsis with fever, leukocytosis and tachycardia. Ultrasound of the right upper quadrant shows concern for cholecystitis. Elevated LFTs. CBD is normal. Discussed with surgery Dr. Tonita CongWoodham. He would like conservative management at this time as patient has mildly elevated troponin. MRCP if patient does not improve. Oxygen saturations were 90% on room air. Patient has no shortness of breath or cough.  PAST MEDICAL HISTORY:   Past Medical History:  Diagnosis Date  . Diabetes mellitus without complication (HCC)   . Hypertension     PAST SURGICAL HISTORY:  History reviewed. No pertinent surgical history.  SOCIAL HISTORY:   Social History  Substance Use Topics  . Smoking status: Never Smoker  . Smokeless tobacco: Never Used  . Alcohol use No    FAMILY HISTORY:   Family History  Problem Relation Age of Onset  . Diabetes Father   . Diabetes Brother     DRUG ALLERGIES:   Allergies  Allergen Reactions  . Sulfa Antibiotics Other (See Comments)    Family history of allergy, mostly rashes. No anaphylaxis reported.    REVIEW OF SYSTEMS:   Review of Systems  Constitutional: Positive for chills, fever and  malaise/fatigue.  HENT: Negative for sore throat.   Eyes: Negative for blurred vision, double vision and pain.  Respiratory: Negative for cough, hemoptysis, shortness of breath and wheezing.   Cardiovascular: Negative for chest pain, palpitations, orthopnea and leg swelling.  Gastrointestinal: Positive for abdominal pain, nausea and vomiting. Negative for constipation, diarrhea and heartburn.  Genitourinary: Negative for dysuria and hematuria.  Musculoskeletal: Negative for back pain and joint pain.  Skin: Negative for rash.  Neurological: Positive for weakness. Negative for sensory change, speech change, focal weakness and headaches.  Endo/Heme/Allergies: Does not bruise/bleed easily.  Psychiatric/Behavioral: Negative for depression. The patient is not nervous/anxious.    MEDICATIONS AT HOME:   Prior to Admission medications   Medication Sig Start Date End Date Taking? Authorizing Provider  amLODipine (NORVASC) 5 MG tablet Take 5 mg by mouth daily. 03/05/17  Yes [provider]  aspirin EC 81 MG tablet Take 81 mg by mouth daily.   Yes [provider]  atorvastatin (LIPITOR) 10 MG tablet Take 10 mg by mouth daily. 04/14/17  Yes [provider]  chlorthalidone (HYGROTON) 25 MG tablet Take 25 mg by mouth daily. 06/03/17  Yes [provider]  LEVEMIR FLEXTOUCH 100 UNIT/ML Pen Inject 50 Units into the skin 2 (two) times daily. 05/28/17  Yes [provider]  lisinopril (PRINIVIL,ZESTRIL) 40 MG tablet Take 40 mg by mouth daily. 04/07/17  Yes [provider]  metFORMIN (GLUCOPHAGE) 500 MG tablet Take 1,000 mg by mouth 2 (two) times daily. 04/14/17  Yes  [provider]  HUMALOG KWIKPEN 200 UNIT/ML SOPN Inject 5-30 Units into the skin 3 (three) times daily with meals as needed. 03/31/17   [provider]  ondansetron (ZOFRAN ODT) 4 MG disintegrating tablet Take 1 tablet (4 mg total) by mouth every 8 (eight) hours as needed for nausea or  vomiting. 06/07/17   Jene Every, MD     VITAL SIGNS:  Blood pressure (!) 119/48, pulse 79, temperature (!) 102.4 F (39.1 C), temperature source Oral, resp. rate 17, height 6\' 3"  (1.905 m), weight 111.1 kg (245 lb), SpO2 96 %.  PHYSICAL EXAMINATION:  Physical Exam  GENERAL:  61 y.o.-year-old patient lying in the bed with no acute distress.  EYES: Pupils equal, round, reactive to light and accommodation. No scleral icterus. Extraocular muscles intact.  HEENT: Head atraumatic, normocephalic. Oropharynx and nasopharynx clear. No oropharyngeal erythema, moist oral Dry NECK:  Supple, no jugular venous distention. No thyroid enlargement, no tenderness.  LUNGS: Normal breath sounds bilaterally, no wheezing, rales, rhonchi. No use of accessory muscles of respiration.  CARDIOVASCULAR: S1, S2 normal. No murmurs, rubs, or gallops.  ABDOMEN: Soft. Right upper quadrant tenderness. Bowel sounds normal. EXTREMITIES: No pedal edema, cyanosis, or clubbing. + 2 pedal & radial pulses b/l.   NEUROLOGIC: Cranial nerves II through XII are intact. No focal Motor or sensory deficits appreciated b/l PSYCHIATRIC: The patient is alert and oriented x 3. Good affect.  SKIN: No obvious rash, lesion, or ulcer.   LABORATORY PANEL:   CBC  Recent Labs Lab 06/10/17 1614  WBC 12.2*  HGB 11.7*  HCT 34.0*  PLT 310   ------------------------------------------------------------------------------------------------------------------  Chemistries   Recent Labs Lab 06/10/17 1614  NA 136  K 3.1*  CL 100*  CO2 23  GLUCOSE 221*  BUN 23*  CREATININE 1.13  CALCIUM 8.3*  AST 228*  ALT 97*  ALKPHOS 362*  BILITOT 3.4*   ------------------------------------------------------------------------------------------------------------------  Cardiac Enzymes  Recent Labs Lab 06/10/17 1614  TROPONINI 0.06*    ------------------------------------------------------------------------------------------------------------------  RADIOLOGY:  Dg Chest 2 View  Result Date: 06/10/2017 CLINICAL DATA:  Fever, chills, and nausea and vomiting for the past 4 5 days. No shortness of breath or chest pain. History of diabetes and hypertension. Nonsmoker. EXAM: CHEST  2 VIEW COMPARISON:  None in PACs FINDINGS: The lungs are adequately inflated. The interstitial markings are slightly increased. The heart is top-normal in size. The pulmonary vascularity is not engorged. The mediastinum is normal in width. There is mild prominence of the perihilar lung markings on the left. There is a cardiac rhythm implantable recording device present. The bony thorax exhibits no acute abnormality. IMPRESSION: Mild interstitial prominence suggests bronchitic change. There is mild central pulmonary vascular prominence however. This could reflect low-grade CHF in the appropriate setting. A follow-up PA and lateral chest x-ray with deep inspiratory effort would be useful. Electronically Signed   By: David  [place]az[place] M.D.   On: 06/10/2017 17:13   US Abdomen Limited Ruq  Result Date: 06/10/2017 CLINICAL DATA:  Right upper quadrant pain with nausea, vomiting, and fever EXAM: ULTRASOUND ABDOMEN LIMITED RIGHT UPPER QUADRANT COMPARISON:  None. FINDINGS: Gallbladder: Gallbladder appears mildly distended. Within the gallbladder, there are small echogenic foci which move and shadow consistent with cholelithiasis. There is also mild sludge in the gallbladder. Gallbladder wall appears somewhat thickened in portions, particularly anteriorly. There is no pericholecystic fluid. No sonographic Murphy sign noted by sonographer. Common bile duct: Diameter: 4 mm. No intrahepatic or extrahepatic biliary duct dilatation. Liver: No focal  lesion identified. Within normal limits in parenchymal echogenicity. Portal vein is patent on color Doppler imaging with normal direction  of blood flow towards the liver. IMPRESSION: Gallbladder is distended. Within the gallbladder, there are small gallstones and mild sludge. Areas of gallbladder wall appear mildly thickened but not edematous. This overall appearance is concerning for cholecystitis. It may be prudent to consider nuclear medicine hepatobiliary imaging study to assess cystic duct patency in this circumstance. Study otherwise unremarkable. Electronically Signed   By: Bretta Bang III M.D.   On: 06/10/2017 18:14     IMPRESSION AND PLAN:   * Acute cholecystitis with sepsis Start IV Zosyn. Patient will be nothing by mouth except medications. Surgical service with like conservative management at this time. If no improvement MRCP. Bolus normal saline 1 L stat.  * Mild elevation of troponin. This is likely from sepsis. Unlikely to be MI. EKG shows nothing acute. We'll repeat troponin. If there is no significant trend up will not need any further investigations.  * Insulin-dependent diabetes mellitus, uncontrolled with hyperglycemia Uncontrolled due to acute infection. Recent hemoglobin A1c was 7.1. We will reduce his Levemir to 15 units twice a day. Add sliding scale insulin. Patient is nothing by mouth.  * Hypertension Continue lisinopril. Hold Norvasc and hydrochlorothiazide.  * DVT prophylaxis with Lovenox  All the records are reviewed and case discussed with ED provider. Management plans discussed with the patient, family and they are in agreement.  CODE STATUS: FULL CODE  TOTAL CRITICAL CARE TIME TAKING CARE OF THIS PATIENT: 40 minutes.   Milagros Loll R M.D on 06/10/2017 at 8:05 PM  Between 7am to 6pm - Pager - [phone]  After 6pm go to [email or web address] - password [email or web address]  [facility]  Office  [phone]  CC: Primary care physician; Eulogio Bear., MD  Note: This dictation was prepared with Dragon dictation along with smaller phrase technology. Any transcriptional errors  that result from this process are unintentional.

## 2017-06-10 NOTE — ED Triage Notes (Signed)
Pt was given tyl 480mg  by ems and zofran 4mg 

## 2017-06-10 NOTE — ED Notes (Signed)
Pharmacy emailed to send zosyn

## 2017-06-11 ENCOUNTER — Inpatient Hospital Stay
Admit: 2017-06-11 | Discharge: 2017-06-11 | Disposition: A | Discharge status: Home / Self Care | Payer: BLUE CROSS/BLUE SHIELD | Attending: Internal Medicine | Admitting: Internal Medicine

## 2017-06-11 DIAGNOSIS — A419 Sepsis, unspecified organism: Secondary | ICD-10-CM

## 2017-06-11 DIAGNOSIS — R740 Nonspecific elevation of levels of transaminase and lactic acid dehydrogenase [LDH]: Secondary | ICD-10-CM

## 2017-06-11 DIAGNOSIS — K819 Cholecystitis, unspecified: Secondary | ICD-10-CM

## 2017-06-11 LAB — BLOOD CULTURE ID PANEL (REFLEXED)
Acinetobacter baumannii: NOT DETECTED
CANDIDA TROPICALIS: NOT DETECTED
CARBAPENEM RESISTANCE: NOT DETECTED
Candida albicans: NOT DETECTED
Candida glabrata: NOT DETECTED
Candida krusei: NOT DETECTED
Candida parapsilosis: NOT DETECTED
ENTEROBACTER CLOACAE COMPLEX: NOT DETECTED
ENTEROBACTERIACEAE SPECIES: DETECTED — AB
Enterococcus species: NOT DETECTED
Escherichia coli: NOT DETECTED
HAEMOPHILUS INFLUENZAE: NOT DETECTED
Klebsiella oxytoca: NOT DETECTED
Klebsiella pneumoniae: DETECTED — AB
LISTERIA MONOCYTOGENES: NOT DETECTED
NEISSERIA MENINGITIDIS: NOT DETECTED
Proteus species: NOT DETECTED
Pseudomonas aeruginosa: NOT DETECTED
STAPHYLOCOCCUS AUREUS BCID: NOT DETECTED
STREPTOCOCCUS AGALACTIAE: NOT DETECTED
STREPTOCOCCUS PNEUMONIAE: NOT DETECTED
Serratia marcescens: NOT DETECTED
Staphylococcus species: NOT DETECTED
Streptococcus pyogenes: NOT DETECTED
Streptococcus species: NOT DETECTED

## 2017-06-11 LAB — COMPREHENSIVE METABOLIC PANEL
ALBUMIN: 2.6 g/dL — AB (ref 3.5–5.0)
ALK PHOS: 295 U/L — AB (ref 38–126)
ALT: 177 U/L — ABNORMAL HIGH (ref 17–63)
AST: 201 U/L — ABNORMAL HIGH (ref 15–41)
Anion gap: 10 (ref 5–15)
BILIRUBIN TOTAL: 5.1 mg/dL — AB (ref 0.3–1.2)
BUN: 32 mg/dL — ABNORMAL HIGH (ref 6–20)
CALCIUM: 7.9 mg/dL — AB (ref 8.9–10.3)
CO2: 26 mmol/L (ref 22–32)
CREATININE: 1.54 mg/dL — AB (ref 0.61–1.24)
Chloride: 103 mmol/L (ref 101–111)
GFR calc Af Amer: 54 mL/min — ABNORMAL LOW (ref >60)
GFR calc non Af Amer: 46 mL/min — ABNORMAL LOW (ref >60)
GLUCOSE: 211 mg/dL — AB (ref 65–99)
Potassium: 3.7 mmol/L (ref 3.5–5.1)
SODIUM: 139 mmol/L (ref 135–145)
TOTAL PROTEIN: 6.2 g/dL — AB (ref 6.5–8.1)

## 2017-06-11 LAB — ECHOCARDIOGRAM COMPLETE
Height: 75 in
Weight: 3896 oz

## 2017-06-11 LAB — PROTIME-INR
INR: 1.37
PROTHROMBIN TIME: 16.8 s — AB (ref 11.4–15.2)

## 2017-06-11 LAB — CBC
HCT: 32.3 % — ABNORMAL LOW (ref 40.0–52.0)
Hemoglobin: 10.9 g/dL — ABNORMAL LOW (ref 13.0–18.0)
MCH: 30.4 pg (ref 26.0–34.0)
MCHC: 33.9 g/dL (ref 32.0–36.0)
MCV: 89.8 fL (ref 80.0–100.0)
PLATELETS: 274 10*3/uL (ref 150–440)
RBC: 3.59 MIL/uL — ABNORMAL LOW (ref 4.40–5.90)
RDW: 13.7 % (ref 11.5–14.5)
WBC: 14.8 10*3/uL — ABNORMAL HIGH (ref 3.8–10.6)

## 2017-06-11 LAB — GLUCOSE, CAPILLARY
GLUCOSE-CAPILLARY: 191 mg/dL — AB (ref 65–99)
GLUCOSE-CAPILLARY: 164 mg/dL — AB (ref 65–99)
Glucose-Capillary: 197 mg/dL — ABNORMAL HIGH (ref 65–99)
Glucose-Capillary: 200 mg/dL — ABNORMAL HIGH (ref 65–99)
Glucose-Capillary: 277 mg/dL — ABNORMAL HIGH (ref 65–99)
Glucose-Capillary: 303 mg/dL — ABNORMAL HIGH (ref 65–99)

## 2017-06-11 LAB — TROPONIN I: Troponin I: 0.11 ng/mL (ref <0.03)

## 2017-06-11 MED ORDER — ASPIRIN 325 MG PO TABS
325.0000 mg | ORAL_TABLET | Freq: Once | ORAL | Status: AC
  Administered 2017-06-11: 325 mg via ORAL
  Filled 2017-06-11: qty 1

## 2017-06-11 MED ORDER — INSULIN ASPART 100 UNIT/ML ~~LOC~~ SOLN
0.0000 [IU] | SUBCUTANEOUS | Status: DC
  Administered 2017-06-11: 15 [IU] via SUBCUTANEOUS
  Administered 2017-06-12: 4 [IU] via SUBCUTANEOUS
  Administered 2017-06-12: 1 [IU] via SUBCUTANEOUS
  Administered 2017-06-12: 3 [IU] via SUBCUTANEOUS
  Administered 2017-06-12 (×2): 4 [IU] via SUBCUTANEOUS
  Administered 2017-06-13 (×2): 3 [IU] via SUBCUTANEOUS
  Administered 2017-06-13: 4 [IU] via SUBCUTANEOUS
  Administered 2017-06-13: 3 [IU] via SUBCUTANEOUS
  Administered 2017-06-13 – 2017-06-14 (×2): 4 [IU] via SUBCUTANEOUS
  Administered 2017-06-15: 3 [IU] via SUBCUTANEOUS
  Filled 2017-06-11 (×13): qty 1

## 2017-06-11 MED ORDER — ENOXAPARIN SODIUM 120 MG/0.8ML ~~LOC~~ SOLN
1.0000 mg/kg | Freq: Two times a day (BID) | SUBCUTANEOUS | Status: DC
  Administered 2017-06-11 – 2017-06-12 (×3): 110 mg via SUBCUTANEOUS
  Filled 2017-06-11 (×4): qty 0.8

## 2017-06-11 MED ORDER — ASPIRIN 300 MG RE SUPP
300.0000 mg | Freq: Once | RECTAL | Status: DC
  Filled 2017-06-11: qty 1

## 2017-06-11 MED ORDER — LISINOPRIL 20 MG PO TABS
40.0000 mg | ORAL_TABLET | Freq: Every day | ORAL | Status: DC
  Administered 2017-06-11 – 2017-06-15 (×4): 40 mg via ORAL
  Filled 2017-06-11 (×4): qty 2

## 2017-06-11 MED ORDER — INSULIN ASPART 100 UNIT/ML ~~LOC~~ SOLN
0.0000 [IU] | Freq: Three times a day (TID) | SUBCUTANEOUS | Status: DC
Start: 2017-06-12 — End: 2017-06-11

## 2017-06-11 MED ORDER — FAMOTIDINE 20 MG PO TABS
20.0000 mg | ORAL_TABLET | Freq: Once | ORAL | Status: AC
  Administered 2017-06-11: 20 mg via ORAL
  Filled 2017-06-11: qty 1

## 2017-06-11 MED ORDER — INSULIN DETEMIR 100 UNIT/ML ~~LOC~~ SOLN
25.0000 [IU] | Freq: Two times a day (BID) | SUBCUTANEOUS | Status: DC
  Administered 2017-06-11 – 2017-06-14 (×5): 25 [IU] via SUBCUTANEOUS
  Filled 2017-06-11 (×10): qty 0.25

## 2017-06-11 MED ORDER — CYCLOBENZAPRINE HCL 10 MG PO TABS
5.0000 mg | ORAL_TABLET | Freq: Every day | ORAL | Status: DC
  Administered 2017-06-11 – 2017-06-12 (×2): 5 mg via ORAL
  Filled 2017-06-11 (×3): qty 1

## 2017-06-11 NOTE — Progress Notes (Signed)
Inpatient Diabetes Program Recommendations  AACE/ADA: New Consensus Statement on Inpatient Glycemic Control (2015)  Target Ranges:  Prepandial:   less than 140 mg/dL      Peak postprandial:   less than 180 mg/dL (1-2 hours)      Critically ill patients:  140 - 180 mg/dL   Lab Results  Component Value Date   GLUCAP 191 (H) 06/11/2017    Review of Glycemic ControlResults for Randy Nelson, Randy Nelson (MRN [id number]) as of 06/11/2017 11:38  Ref. Range 06/10/2017 22:05 06/11/2017 00:18 06/11/2017 03:58 06/11/2017 07:50  Glucose-Capillary Latest Ref Range: 65 - 99 mg/dL 409274 (H) 811277 (H) 914197 (H) 191 (H)   Diabetes history: Type 2 diabetes-A1C on 05/06/17=7.1% per Care Everywhere Outpatient Diabetes medications: Levemir 50 units bid, Humalog 5-30 units tid with meals, Metformin 1000 mg bid Current orders for Inpatient glycemic control: Levemir 25 units bid, Novolog moderate q 4 hours  Inpatient Diabetes Program Recommendations:    Referral received.  Agree with current orders of 1/2 of home dose of basal insulin.  Will follow.  Thanks, Beryl MeagerJenny Maccoy Haubner, RN, BC-ADM Inpatient Diabetes Coordinator Pager [phone] (8a-5p)

## 2017-06-11 NOTE — Progress Notes (Signed)
PHARMACY - PHYSICIAN COMMUNICATION CRITICAL VALUE ALERT - BLOOD CULTURE IDENTIFICATION (BCID)  Results for orders placed or performed during the hospital encounter of 06/10/17  Blood Culture ID Panel (Reflexed) (Collected: 06/10/2017  4:14 PM)  Result Value Ref Range   Enterococcus species NOT DETECTED NOT DETECTED   Listeria monocytogenes NOT DETECTED NOT DETECTED   Staphylococcus species NOT DETECTED NOT DETECTED   Staphylococcus aureus NOT DETECTED NOT DETECTED   Streptococcus species NOT DETECTED NOT DETECTED   Streptococcus agalactiae NOT DETECTED NOT DETECTED   Streptococcus pneumoniae NOT DETECTED NOT DETECTED   Streptococcus pyogenes NOT DETECTED NOT DETECTED   Acinetobacter baumannii NOT DETECTED NOT DETECTED   Enterobacteriaceae species DETECTED (A) NOT DETECTED   Enterobacter cloacae complex NOT DETECTED NOT DETECTED   Escherichia coli NOT DETECTED NOT DETECTED   Klebsiella oxytoca NOT DETECTED NOT DETECTED   Klebsiella pneumoniae DETECTED (A) NOT DETECTED   Proteus species NOT DETECTED NOT DETECTED   Serratia marcescens NOT DETECTED NOT DETECTED   Carbapenem resistance NOT DETECTED NOT DETECTED   Haemophilus influenzae NOT DETECTED NOT DETECTED   Neisseria meningitidis NOT DETECTED NOT DETECTED   Pseudomonas aeruginosa NOT DETECTED NOT DETECTED   Candida albicans NOT DETECTED NOT DETECTED   Candida glabrata NOT DETECTED NOT DETECTED   Candida krusei NOT DETECTED NOT DETECTED   Candida parapsilosis NOT DETECTED NOT DETECTED   Candida tropicalis NOT DETECTED NOT DETECTED    Name of physician (or Provider) Contacted: Dr. Luberta MutterKonidena  Changes to prescribed antibiotics required: None - k pneumo preferred coverage is ceftriaxone, however possible cholecystitis/IAI source - would leave Zosyn for now. Luberta MutterKonidena agrees with no change.  Carola FrostNathan A Issaih Kaus, Pharm.D., BCPS Clinical Pharmacist 06/11/2017  7:51 AM

## 2017-06-11 NOTE — Consult Note (Signed)
Randy Repressohini R Vanga, MD [address]  [address]  [place], [place] [address]  Main: [phone]  Fax: [phone] Pager: [phone]   Consultation  Referring Provider:     No ref. provider found Primary Care Physician:  Eulogio BearManor, James P., MD Primary Gastroenterologist: None         Reason for Consultation:     Hyperbilirubinemia  Date of Admission:  06/10/2017 Date of Consultation:  06/11/2017         HPI:   Randy Nelson is a 61 y.o. male with diabetes on insulin, hypertension who initially risen to 2 ER on 06/07/2017 secondary to nausea vomiting and upper abdominal pain, at the time his LFTs were normal and he was sent home. He returned to ER yesterday with continued symptoms along with fever 103 and was found to have elevated LFTs, right upper quadrant ultrasound was concerning for calculus cholecystitis. CBD was normal. Today his LFTs continued to get worse, T bili increased from 3.4-5.1. Lipase was normal, has mild leukocytosis. GI consulted to evaluate for possible choledocholithiasis. Denies having similar symptoms in the past. He was started on Zosyn. He was comfortable when I saw him. He denies any family history of gallbladder cancer or liver cancer. He does not smoke or drink alcohol. He is wearing an internal cardiac monitor.   GI Procedures:    Past Medical History:  Diagnosis Date  . Diabetes mellitus without complication (HCC)   . Hypertension     History reviewed. No pertinent surgical history.  Prior to Admission medications   Medication Sig Start Date End Date Taking? Authorizing Provider  amLODipine (NORVASC) 5 MG tablet Take 5 mg by mouth daily. 03/05/17  Yes [provider]  aspirin EC 81 MG tablet Take 81 mg by mouth daily.   Yes [provider]  atorvastatin (LIPITOR) 10 MG tablet Take 10 mg by mouth daily. 04/14/17  Yes [provider]  chlorthalidone (HYGROTON) 25 MG tablet Take 25 mg by mouth daily. 06/03/17  Yes [provider]  LEVEMIR FLEXTOUCH 100 UNIT/ML Pen Inject 50 Units into the skin 2 (two) times daily. 05/28/17  Yes [provider]  lisinopril (PRINIVIL,ZESTRIL) 40 MG tablet Take 40 mg by mouth daily. 04/07/17  Yes [provider]  metFORMIN (GLUCOPHAGE) 500 MG tablet Take 1,000 mg by mouth 2 (two) times daily. 04/14/17  Yes [provider]  HUMALOG KWIKPEN 200 UNIT/ML SOPN Inject 5-30 Units into the skin 3 (three) times daily with meals as needed. 03/31/17   [provider]  ondansetron (ZOFRAN ODT) 4 MG disintegrating tablet Take 1 tablet (4 mg total) by mouth every 8 (eight) hours as needed for nausea or vomiting. 06/07/17   Jene EveryKinner, Robert, MD    Family History  Problem Relation Age of Onset  . Diabetes Father   . Diabetes Brother      Social History  Substance Use Topics  . Smoking status: Never Smoker  . Smokeless tobacco: Never Used  . Alcohol use No    Allergies as of 06/10/2017 - Review Complete 06/10/2017  Allergen Reaction Noted  . Sulfa antibiotics Other (See Comments) 06/07/2017    Review of Systems:    All systems reviewed and negative except where noted in HPI.   Physical Exam:  Vital signs in last 24 hours: Temp:  [98.1 F (36.7 C)-100.2 F (37.9 C)] 100.2 F (37.9 C) (09/05 1658) Pulse Rate:  [71-85] 85 (09/05 1658) Resp:  [8-26] 18 (09/05 1658)  BP: (116-158)/(48-59) 142/50 (09/05 1658) SpO2:  [93 %-98 %] 95 % (09/05 1658) Weight:  [243 lb 8 oz (110.5 kg)] 243 lb 8 oz (110.5 kg) (09/05 0536)   General:   Pleasant, cooperative in NAD Head:  Normocephalic and atraumatic. Eyes:  Mild icterus.   Conjunctiva pink. PERRLA. Ears:  Normal auditory acuity. Neck:  Supple; no masses or thyroidomegaly Lungs: Respirations even and unlabored. Lungs clear to auscultation bilaterally.   No wheezes, crackles, or rhonchi.  Heart:  Regular rate and rhythm;  Without murmur, clicks, rubs or gallops Abdomen:  Soft, nondistended, moderate right  upper quadrant tenderness. Normal bowel sounds. No appreciable masses or hepatomegaly.  No rebound or guarding.  Rectal:  Not performed. Msk:  Symmetrical without gross deformities.  Strength normal Extremities:  Without edema, cyanosis or clubbing. Neurologic:  Alert and oriented x3;  grossly normal neurologically. Skin:  Intact without significant lesions or rashes. Psych:  Alert and cooperative. Normal affect.  LAB RESULTS:  Recent Labs  06/10/17 1614 06/11/17 0506  WBC 12.2* 14.8*  HGB 11.7* 10.9*  HCT 34.0* 32.3*  PLT 310 274   BMET  Recent Labs  06/10/17 1614 06/11/17 0506  NA 136 139  K 3.1* 3.7  CL 100* 103  CO2 23 26  GLUCOSE 221* 211*  BUN 23* 32*  CREATININE 1.13 1.54*  CALCIUM 8.3* 7.9*   LFT  Recent Labs  06/11/17 0506  PROT 6.2*  ALBUMIN 2.6*  AST 201*  ALT 177*  ALKPHOS 295*  BILITOT 5.1*   PT/INR  Recent Labs  06/11/17 0506  LABPROT 16.8*  INR 1.37    STUDIES: Dg Chest 2 View  Result Date: 06/10/2017 CLINICAL DATA:  Fever, chills, and nausea and vomiting for the past 4 5 days. No shortness of breath or chest pain. History of diabetes and hypertension. Nonsmoker. EXAM: CHEST  2 VIEW COMPARISON:  None in PACs FINDINGS: The lungs are adequately inflated. The interstitial markings are slightly increased. The heart is top-normal in size. The pulmonary vascularity is not engorged. The mediastinum is normal in width. There is mild prominence of the perihilar lung markings on the left. There is a cardiac rhythm implantable recording device present. The bony thorax exhibits no acute abnormality. IMPRESSION: Mild interstitial prominence suggests bronchitic change. There is mild central pulmonary vascular prominence however. This could reflect low-grade CHF in the appropriate setting. A follow-up PA and lateral chest x-ray with deep inspiratory effort would be useful. Electronically Signed   By: David  [place]az[place] M.D.   On: 06/10/2017 17:13   US Abdomen  Limited Ruq  Result Date: 06/10/2017 CLINICAL DATA:  Right upper quadrant pain with nausea, vomiting, and fever EXAM: ULTRASOUND ABDOMEN LIMITED RIGHT UPPER QUADRANT COMPARISON:  None. FINDINGS: Gallbladder: Gallbladder appears mildly distended. Within the gallbladder, there are small echogenic foci which move and shadow consistent with cholelithiasis. There is also mild sludge in the gallbladder. Gallbladder wall appears somewhat thickened in portions, particularly anteriorly. There is no pericholecystic fluid. No sonographic Murphy sign noted by sonographer. Common bile duct: Diameter: 4 mm. No intrahepatic or extrahepatic biliary duct dilatation. Liver: No focal lesion identified. Within normal limits in parenchymal echogenicity. Portal vein is patent on color Doppler imaging with normal direction of blood flow towards the liver. IMPRESSION: Gallbladder is distended. Within the gallbladder, there are small gallstones and mild sludge. Areas of gallbladder wall appear mildly thickened but not edematous. This overall appearance is concerning for cholecystitis. It may be prudent to consider nuclear medicine  hepatobiliary imaging study to assess cystic duct patency in this circumstance. Study otherwise unremarkable. Electronically Signed   By: Bretta Bang III M.D.   On: 06/10/2017 18:14      Impression / Plan:   Javari Bufkin is a 61 y.o. y/o male with Diabetes on insulin, hypertension presents with acute calculus cholecystitis and hyperbilirubinemia. Right upper quadrant ultrasound shows normal caliber CBD. Based on the findings, he has intermediate probability for choledocholithiasis. I recommend MRCP but we are unable to perform as the patient is holding an implanted cardiac monitor and he does not have any information of the manufacturer. If his bilirubin continues to rise by tomorrow, recommend ERCP prior to cholecystectomy. No evidence of gallstone pancreatitis  - Continue Zosyn - Okay for  clear liquid diet - Monitor LFTs daily, if T bili is rising in next 24 hours, plan for ERCP prior to cholecystectomy - Nothing by mouth past midnight  Thank you for involving me in the care of this patient.      LOS: 1 day   Lannette Donath, MD  06/11/2017, 6:14 PM   Note: This dictation was prepared with Dragon dictation along with smaller phrase technology. Any transcriptional errors that result from this process are unintentional.

## 2017-06-11 NOTE — Progress Notes (Signed)
Called by RN for abnormal troponin this morning (after 6 am ) Will increase dose of lovenox 1mg /kg [place] q 12hrly No chest pain Aspirin suppository as patient is npo Cardiology consult Check echocardiogram

## 2017-06-11 NOTE — Progress Notes (Signed)
[facility] [place] [facility]   PATIENT NAME: Randy ReiningLeslie Nelson    MR#:  [id number]  DATE OF BIRTH:  10-Dec-1955  SUBJECTIVE: Patient is admitted for fever, nausea, chills. Started on IV Zosyn for sepsis. No fever. Blood sugar high. No nausea.  still has mild right upper quadrant pain.   CHIEF COMPLAINT:   Chief Complaint  Patient presents with  . Fever  . Nausea    REVIEW OF SYSTEMS:   ROS CONSTITUTIONAL: No fever, fatigue or weakness.  EYES: No blurred or double vision.  EARS, NOSE, AND THROAT: No tinnitus or ear pain.  RESPIRATORY: No cough, shortness of breath, wheezing or hemoptysis.  CARDIOVASCULAR: No chest pain, orthopnea, edema.  GASTROINTESTINAL: No nausea, vomiting, diarrhea Mild upper right upper quadrant pain.  GENITOURINARY: No dysuria, hematuria.  ENDOCRINE: No polyuria, nocturia,  HEMATOLOGY: No anemia, easy bruising or bleeding SKIN: No rash or lesion. MUSCULOSKELETAL: No joint pain or arthritis.   NEUROLOGIC: No tingling, numbness, weakness.  PSYCHIATRY: No anxiety or depression.   DRUG ALLERGIES:   Allergies  Allergen Reactions  . Sulfa Antibiotics Other (See Comments)    Family history of allergy, mostly rashes. No anaphylaxis reported.    VITALS:  Blood pressure (!) 135/55, pulse 82, temperature 98.1 F (36.7 C), temperature source Oral, resp. rate 20, height 6\' 3"  (1.905 m), weight 110.5 kg (243 lb 8 oz), SpO2 96 %.  PHYSICAL EXAMINATION:  GENERAL:  61 y.o.-year-old patient lying in the bed with no acute distress.  EYES: Pupils equal, round, reactive to light  No scleral icterus. Extraocular muscles intact.  HEENT: Head atraumatic, normocephalic. Oropharynx and nasopharynx clear.  NECK:  Supple, no jugular venous distention. No thyroid enlargement, no tenderness.  LUNGS: Normal breath sounds bilaterally, no wheezing, rales,rhonchi or crepitation. No use of accessory muscles of respiration.  CARDIOVASCULAR: S1, S2  normal. No murmurs, rubs, or gallops.  ABDOMEN: Mild right upper quadrant tenderness present, nondistended. Bowel sounds present. No organomegaly or mass.  EXTREMITIES: No pedal edema, cyanosis, or clubbing.  NEUROLOGIC: Cranial nerves II through XII are intact. Muscle strength 5/5 in all extremities. Sensation intact. Gait not checked.  PSYCHIATRIC: The patient is alert and oriented x 3.  SKIN: No obvious rash, lesion, or ulcer.    LABORATORY PANEL:   CBC  Recent Labs Lab 06/11/17 0506  WBC 14.8*  HGB 10.9*  HCT 32.3*  PLT 274   ------------------------------------------------------------------------------------------------------------------  Chemistries   Recent Labs Lab 06/11/17 0506  NA 139  K 3.7  CL 103  CO2 26  GLUCOSE 211*  BUN 32*  CREATININE 1.54*  CALCIUM 7.9*  AST 201*  ALT 177*  ALKPHOS 295*  BILITOT 5.1*   ------------------------------------------------------------------------------------------------------------------  Cardiac Enzymes  Recent Labs Lab 06/11/17 0506  TROPONINI 0.11*   ------------------------------------------------------------------------------------------------------------------  RADIOLOGY:  Dg Chest 2 View  Result Date: 06/10/2017 CLINICAL DATA:  Fever, chills, and nausea and vomiting for the past 4 5 days. No shortness of breath or chest pain. History of diabetes and hypertension. Nonsmoker. EXAM: CHEST  2 VIEW COMPARISON:  None in PACs FINDINGS: The lungs are adequately inflated. The interstitial markings are slightly increased. The heart is top-normal in size. The pulmonary vascularity is not engorged. The mediastinum is normal in width. There is mild prominence of the perihilar lung markings on the left. There is a cardiac rhythm implantable recording device present. The bony thorax exhibits no acute abnormality. IMPRESSION: Mild interstitial prominence suggests bronchitic change. There is mild central pulmonary  vascular  prominence however. This could reflect low-grade CHF in the appropriate setting. A follow-up PA and lateral chest x-ray with deep inspiratory effort would be useful. Electronically Signed   By: David  [place] M.D.   On: 06/10/2017 17:13   US Abdomen Limited Ruq  Result Date: 06/10/2017 CLINICAL DATA:  Right upper quadrant pain with nausea, vomiting, and fever EXAM: ULTRASOUND ABDOMEN LIMITED RIGHT UPPER QUADRANT COMPARISON:  None. FINDINGS: Gallbladder: Gallbladder appears mildly distended. Within the gallbladder, there are small echogenic foci which move and shadow consistent with cholelithiasis. There is also mild sludge in the gallbladder. Gallbladder wall appears somewhat thickened in portions, particularly anteriorly. There is no pericholecystic fluid. No sonographic Murphy sign noted by sonographer. Common bile duct: Diameter: 4 mm. No intrahepatic or extrahepatic biliary duct dilatation. Liver: No focal lesion identified. Within normal limits in parenchymal echogenicity. Portal vein is patent on color Doppler imaging with normal direction of blood flow towards the liver. IMPRESSION: Gallbladder is distended. Within the gallbladder, there are small gallstones and mild sludge. Areas of gallbladder wall appear mildly thickened but not edematous. This overall appearance is concerning for cholecystitis. It may be prudent to consider nuclear medicine hepatobiliary imaging study to assess cystic duct patency in this circumstance. Study otherwise unremarkable. Electronically Signed   By: Bretta Bang III M.D.   On: 06/10/2017 18:14    EKG:   Orders placed or performed during the hospital encounter of 06/10/17  . ED EKG 12-Lead  . ED EKG  . ED EKG 12-Lead  . ED EKG    ASSESSMENT AND PLAN:   #1.acute cholecystitis with cholangitis: Patient had chills and fever, right upper quadrant pain and nausea and vomiting for last 2 days. Patient is receiving IV fluids, IV antibiotics, blood cultures showed  klebsiella , enterococci . Continue Zosyn,. Follow fever curve and sensitivity results. Seen by surgery for acute cholecystitis. Recommended gastroenterology consult. #2 kidney injury with acute renal failure and sepsis continue IV hydration, monitor kidney function, avoid nephrotoxic agents, discontinue metformin. Discussed this with patient. #3/ slightly elevated troponins likely due to demand ischemia from sepsis. Seen by cardiology. Echocardiogram was ordered, results are pending. #4 .obstructive jaundice with elevated alkaline phosphatase, AST, ALT, bilirubin. GI consult is pending. Patient wants to try clear liquids.follow trend.  #5. diabetes mellitus type 2 with hyperglycemia: Patient told me that he will take the Levemir 50 units twice a day and wants to at least half the dose of Levemir. Consult diabetes coordinator, start Levemir 25 units twice a day, continue SSI with coverage.     All the records are reviewed and case discussed with Care Management/Social Workerr. Management plans discussed with the patient, family and they are in agreement.  CODE STATUS:full  TOTAL TIME TAKING CARE OF THIS PATIENT:35 minutes.   POSSIBLE D/C IN 1-2 DAYS, DEPENDING ON CLINICAL CONDITION.   Katha Hamming M.D on 06/11/2017 at 1:31 PM  Between 7am to 6pm - Pager - [phone]  After 6pm go to [email or web address] - password [email or web address] [facility]  [facility]  Office  [phone]  CC: Primary care physician; Eulogio Bear., MD   Note: This dictation was prepared with Dragon dictation along with smaller phrase technology. Any transcriptional errors that result from this process are unintentional.

## 2017-06-11 NOTE — Progress Notes (Signed)
   06/11/17 0742  Clinical Encounter Type  Visited With Patient  Visit Type Initial  Referral From Nurse  Consult/Referral To Chaplain  Spiritual Encounters  Spiritual Needs Brochure

## 2017-06-11 NOTE — Progress Notes (Signed)
Spoke with Dr. Allegra LaiVanga - MRI spoke with patient regarding implanted cardiac device.  Device was placed while patient was in [place]; he does not remember MD or hospital name.  Per MRI they are unable to clear him for MRCP at this time.

## 2017-06-11 NOTE — Progress Notes (Signed)
*  PRELIMINARY RESULTS* Echocardiogram 2D Echocardiogram has been performed.  Cristela BlueHege, Diontae Route 06/11/2017, 10:15 AM

## 2017-06-11 NOTE — Consult Note (Signed)
[facility] Consultation Note  Patient ID: Randy Nelson, MRN: [id number], DOB/AGE: 1955-10-16 61 y.o. Admit date: 06/10/2017   Date of Consult: 06/11/2017 Primary Physician: Eulogio BearManor, James P., MD Primary Cardiologist: None  Chief Complaint:  Chief Complaint  Patient presents with  . Fever  . Nausea   Reason for Consult: elevated troponin  HPI: 61 y.o. male with no evidence of previous cardiovascular history but does have some chronic kidney disease stage III essential hypertension and diabetes. The patient has been on appropriate medication management for hypertension control including ACE inhibitor but claims his lipid profile is improved with atorvastatin. His diabetes has been well-controlled with current medical regimen when he has had significant waxing and waning of nausea vomiting and abdominal pain over the last several weeks. This culminated in significant abdominal pain when he was seen in the emergency room. At that time the patient has had further evaluation from surgery suggesting of gallbladder abnormalities. With antibiotics and supportive care of the patient has had a significant improvements of symptoms. He has had a normal EKG and a troponin of 0.11 but no evidence of chest pain or other congestive heart failure. Additionally he has had no evidence of significant symptoms of shortness of breath true angina in the last several weeks. Currently therefore he is at lowest risk possible for cardiovascular complication of surgical intervention is necessary  Past Medical History:  Diagnosis Date  . Diabetes mellitus without complication (HCC)   . Hypertension       Surgical History: History reviewed. No pertinent surgical history.   Home Meds: Prior to Admission medications   Medication Sig Start Date End Date Taking? Authorizing Provider  amLODipine (NORVASC) 5 MG tablet Take 5 mg by mouth daily. 03/05/17  Yes [provider]  aspirin EC 81 MG tablet  Take 81 mg by mouth daily.   Yes [provider]  atorvastatin (LIPITOR) 10 MG tablet Take 10 mg by mouth daily. 04/14/17  Yes [provider]  chlorthalidone (HYGROTON) 25 MG tablet Take 25 mg by mouth daily. 06/03/17  Yes [provider]  LEVEMIR FLEXTOUCH 100 UNIT/ML Pen Inject 50 Units into the skin 2 (two) times daily. 05/28/17  Yes [provider]  lisinopril (PRINIVIL,ZESTRIL) 40 MG tablet Take 40 mg by mouth daily. 04/07/17  Yes [provider]  metFORMIN (GLUCOPHAGE) 500 MG tablet Take 1,000 mg by mouth 2 (two) times daily. 04/14/17  Yes [provider]  HUMALOG KWIKPEN 200 UNIT/ML SOPN Inject 5-30 Units into the skin 3 (three) times daily with meals as needed. 03/31/17   [provider]  ondansetron (ZOFRAN ODT) 4 MG disintegrating tablet Take 1 tablet (4 mg total) by mouth every 8 (eight) hours as needed for nausea or vomiting. 06/07/17   Jene EveryKinner, Robert, MD    Inpatient Medications:  . enoxaparin (LOVENOX) injection  1 mg/kg Subcutaneous Q12H  . insulin aspart  0-15 Units Subcutaneous Q4H  . insulin detemir  25 Units Subcutaneous BID  . lisinopril  40 mg Oral Daily  . sodium chloride flush  3 mL Intravenous Q12H   . 0.9 % NaCl with KCl 20 mEq / L 100 mL/hr at 06/11/17 0847  . piperacillin-tazobactam (ZOSYN)  IV 3.375 g (06/11/17 1102)  . sodium chloride      Allergies:  Allergies  Allergen Reactions  . Sulfa Antibiotics Other (See Comments)    Family history of allergy, mostly rashes. No anaphylaxis reported.    Social History   Social  History  . Marital status: Married    Spouse name: N/A  . Number of children: N/A  . Years of education: N/A   Occupational History  . Not on file.   Social History Main Topics  . Smoking status: Never Smoker  . Smokeless tobacco: Never Used  . Alcohol use No  . Drug use: No  . Sexual activity: Not on file   Other Topics Concern  . Not on file   Social History Narrative  .  No narrative on file     Family History  Problem Relation Age of Onset  . Diabetes Father   . Diabetes Brother      Review of Systems Positive for Abdominal pain nausea vomiting Negative for: General:  chills, fever, night sweats or weight changes.  Cardiovascular: PND orthopnea syncope dizziness  Dermatological skin lesions rashes Respiratory: Cough congestion Urologic: Frequent urination urination at night and hematuria Abdominal: Positive for nausea, vomiting, negative for diarrhea, bright red blood per rectum, melena, or hematemesis Neurologic: negative for visual changes, and/or hearing changes  All other systems reviewed and are otherwise negative except as noted above.  Labs:  Recent Labs  06/10/17 1614 06/10/17 2252 06/11/17 0506  TROPONINI 0.06* 0.23* 0.11*   Lab Results  Component Value Date   WBC 14.8 (H) 06/11/2017   HGB 10.9 (L) 06/11/2017   HCT 32.3 (L) 06/11/2017   MCV 89.8 06/11/2017   PLT 274 06/11/2017    Recent Labs Lab 06/11/17 0506  NA 139  K 3.7  CL 103  CO2 26  BUN 32*  CREATININE 1.54*  CALCIUM 7.9*  PROT 6.2*  BILITOT 5.1*  ALKPHOS 295*  ALT 177*  AST 201*  GLUCOSE 211*   No results found for: CHOL, HDL, LDLCALC, TRIG No results found for: DDIMER  Radiology/Studies:  Dg Chest 2 View  Result Date: 06/10/2017 CLINICAL DATA:  Fever, chills, and nausea and vomiting for the past 4 5 days. No shortness of breath or chest pain. History of diabetes and hypertension. Nonsmoker. EXAM: CHEST  2 VIEW COMPARISON:  None in PACs FINDINGS: The lungs are adequately inflated. The interstitial markings are slightly increased. The heart is top-normal in size. The pulmonary vascularity is not engorged. The mediastinum is normal in width. There is mild prominence of the perihilar lung markings on the left. There is a cardiac rhythm implantable recording device present. The bony thorax exhibits no acute abnormality. IMPRESSION: Mild interstitial  prominence suggests bronchitic change. There is mild central pulmonary vascular prominence however. This could reflect low-grade CHF in the appropriate setting. A follow-up PA and lateral chest x-ray with deep inspiratory effort would be useful. Electronically Signed   By: David  [place] M.D.   On: 06/10/2017 17:13   US Abdomen Limited Ruq  Result Date: 06/10/2017 CLINICAL DATA:  Right upper quadrant pain with nausea, vomiting, and fever EXAM: ULTRASOUND ABDOMEN LIMITED RIGHT UPPER QUADRANT COMPARISON:  None. FINDINGS: Gallbladder: Gallbladder appears mildly distended. Within the gallbladder, there are small echogenic foci which move and shadow consistent with cholelithiasis. There is also mild sludge in the gallbladder. Gallbladder wall appears somewhat thickened in portions, particularly anteriorly. There is no pericholecystic fluid. No sonographic Murphy sign noted by sonographer. Common bile duct: Diameter: 4 mm. No intrahepatic or extrahepatic biliary duct dilatation. Liver: No focal lesion identified. Within normal limits in parenchymal echogenicity. Portal vein is patent on color Doppler imaging with normal direction of blood flow towards the liver. IMPRESSION: Gallbladder is distended. Within the gallbladder,  there are small gallstones and mild sludge. Areas of gallbladder wall appear mildly thickened but not edematous. This overall appearance is concerning for cholecystitis. It may be prudent to consider nuclear medicine hepatobiliary imaging study to assess cystic duct patency in this circumstance. Study otherwise unremarkable. Electronically Signed   By: Bretta Bang III M.D.   On: 06/10/2017 18:14    EKG: Normal sinus rhythm  Weights: Filed Weights   06/10/17 1559 06/10/17 2101 06/11/17 0536  Weight: 111.1 kg (245 lb) 110.5 kg (243 lb 8 oz) 110.5 kg (243 lb 8 oz)     Physical Exam: Blood pressure (!) 135/55, pulse 82, temperature 98.1 F (36.7 C), temperature source Oral, resp. rate  20, height 6\' 3"  (1.905 m), weight 110.5 kg (243 lb 8 oz), SpO2 96 %. Body mass index is 30.44 kg/m. General: Well developed, well nourished, in no acute distress. Head eyes ears nose throat: Normocephalic, atraumatic, sclera non-icteric, no xanthomas, nares are without discharge. No apparent thyromegaly and/or mass  Lungs: Normal respiratory effort.  no wheezes, no rales, no rhonchi.  Heart: RRR with normal S1 S2. no murmur gallop, no rub, PMI is normal size and placement, carotid upstroke normal without bruit, jugular venous pressure is normal Abdomen: Soft,  -tender, distended with normoactive bowel sounds. No hepatomegaly. No rebound/guarding. No obvious abdominal masses. Abdominal aorta is normal size without bruit Extremities: No edema. no cyanosis, no clubbing, no ulcers  Peripheral : 2+ bilateral upper extremity pulses, 2+ bilateral femoral pulses, 2+ bilateral dorsal pedal pulse Neuro: Alert and oriented. No facial asymmetry. No focal deficit. Moves all extremities spontaneously. Musculoskeletal: Normal muscle tone without kyphosis Psych:  Responds to questions appropriately with a normal affect.    Assessment: 61 year old male with diabetes essential hypertension mixed hyperlipidemia on appropriate medication management with chronic kidney disease stage III having abdominal pain and epigastric issues including gallbladder changes currently improving without evidence of heart failure or myocardial infarction  Plan: 1. Continue supportive care of the cholecystitis and/or gastroenteritis 2. No further cardiac diagnostics necessary at this time due to no evidence of symptomatic acute coronary syndrome or heart failure next  3. Continue high intensity cholesterol therapy for risk reduction cardiovascular disease in the future 4. ACE inhibitor for hypertension control 5. Begin ambulation and follow for improvements of symptoms and possible discharge to home from cardiac standpoint and  further follow-up in the future  Signed, Lamar Blinks M.D. [facility] Cardiology 06/11/2017, 12:57 PM

## 2017-06-11 NOTE — Progress Notes (Signed)
06/11/2017  Subjective: Patient admitted yesterday with elevated LFTs and concern for possible cholecystitis.  He reports no pain this morning and only had some nausea last night when transporting to his room from ED.  Vital signs: Temp:  [98.5 F (36.9 C)-102.4 F (39.1 C)] 98.5 F (36.9 C) (09/05 0404) Pulse Rate:  [71-95] 79 (09/05 1102) Resp:  [8-26] 22 (09/05 0404) BP: (105-158)/(48-68) 137/53 (09/05 1102) SpO2:  [90 %-98 %] 95 % (09/05 0611) Weight:  [110.5 kg (243 lb 8 oz)-111.1 kg (245 lb)] 110.5 kg (243 lb 8 oz) (09/05 0536)   Intake/Output: 09/04 0701 - 09/05 0700 In: 1491 [I.V.:391; IV Piggyback:1100] Out: 250 [Urine:250]    Physical Exam: Constitutional:  No acute distress Abdomen:  Soft, nondistended, nontender to palpation.  Labs:   Recent Labs  06/10/17 1614 06/11/17 0506  WBC 12.2* 14.8*  HGB 11.7* 10.9*  HCT 34.0* 32.3*  PLT 310 274    Recent Labs  06/10/17 1614 06/11/17 0506  NA 136 139  K 3.1* 3.7  CL 100* 103  CO2 23 26  GLUCOSE 221* 211*  BUN 23* 32*  CREATININE 1.13 1.54*  CALCIUM 8.3* 7.9*    Recent Labs  06/11/17 0506  LABPROT 16.8*  INR 1.37    Imaging: Dg Chest 2 View  Result Date: 06/10/2017 CLINICAL DATA:  Fever, chills, and nausea and vomiting for the past 4 5 days. No shortness of breath or chest pain. History of diabetes and hypertension. Nonsmoker. EXAM: CHEST  2 VIEW COMPARISON:  None in PACs FINDINGS: The lungs are adequately inflated. The interstitial markings are slightly increased. The heart is top-normal in size. The pulmonary vascularity is not engorged. The mediastinum is normal in width. There is mild prominence of the perihilar lung markings on the left. There is a cardiac rhythm implantable recording device present. The bony thorax exhibits no acute abnormality. IMPRESSION: Mild interstitial prominence suggests bronchitic change. There is mild central pulmonary vascular prominence however. This could reflect  low-grade CHF in the appropriate setting. A follow-up PA and lateral chest x-ray with deep inspiratory effort would be useful. Electronically Signed   By: David  [place]azil[place] M.D.   On: 06/10/2017 17:13   US Abdomen Limited Ruq  Result Date: 06/10/2017 CLINICAL DATA:  Right upper quadrant pain with nausea, vomiting, and fever EXAM: ULTRASOUND ABDOMEN LIMITED RIGHT UPPER QUADRANT COMPARISON:  None. FINDINGS: Gallbladder: Gallbladder appears mildly distended. Within the gallbladder, there are small echogenic foci which move and shadow consistent with cholelithiasis. There is also mild sludge in the gallbladder. Gallbladder wall appears somewhat thickened in portions, particularly anteriorly. There is no pericholecystic fluid. No sonographic Murphy sign noted by sonographer. Common bile duct: Diameter: 4 mm. No intrahepatic or extrahepatic biliary duct dilatation. Liver: No focal lesion identified. Within normal limits in parenchymal echogenicity. Portal vein is patent on color Doppler imaging with normal direction of blood flow towards the liver. IMPRESSION: Gallbladder is distended. Within the gallbladder, there are small gallstones and mild sludge. Areas of gallbladder wall appear mildly thickened but not edematous. This overall appearance is concerning for cholecystitis. It may be prudent to consider nuclear medicine hepatobiliary imaging study to assess cystic duct patency in this circumstance. Study otherwise unremarkable. Electronically Signed   By: Bretta Bang III M.D.   On: 06/10/2017 18:14    Assessment/Plan: 61 yo male with elevated LFTs  --Currently non-tender on exam.  However, his total bilirubin is more elevated today.  There was no CBD dilation on ultrasound yesterday.  Have placed order for GI consultation for further evaluation.  May need MRCP to further evaluate biliary etiology. --Troponin bumped last night to 0.23 with improvement this morning to 0.11.  Echo and cardiology consult have  been ordered.  Depending on cardiac findings, may not be a candidate for surgical intervention if cholecystectomy is needed.  In meantime, continue with antibiotic management. --Will continue to follow along with you.   Howie IllJose Luis Carrera Kiesel, MD [facility] ASCOM 7a-7p: [phone] ASCOM 7p-7a: [phone]

## 2017-06-11 NOTE — Progress Notes (Signed)
Spoke with Dr. Tobi BastosPyreddy about elevated troponins of .23 and .11 . Dr. Tobi BastosPyreddy stated he would put in orders. Randy Nelson,Randy Nelson

## 2017-06-12 ENCOUNTER — Encounter: Payer: Self-pay | Admitting: *Deleted

## 2017-06-12 ENCOUNTER — Encounter
Admission: EM | Disposition: A | Discharge status: Other Acute Inpatient Hospital | Payer: Self-pay | Source: Home / Self Care | Attending: Internal Medicine

## 2017-06-12 ENCOUNTER — Inpatient Hospital Stay: Payer: BLUE CROSS/BLUE SHIELD | Admitting: Anesthesiology

## 2017-06-12 ENCOUNTER — Inpatient Hospital Stay: Payer: BLUE CROSS/BLUE SHIELD

## 2017-06-12 DIAGNOSIS — K8050 Calculus of bile duct without cholangitis or cholecystitis without obstruction: Secondary | ICD-10-CM

## 2017-06-12 DIAGNOSIS — R17 Unspecified jaundice: Secondary | ICD-10-CM

## 2017-06-12 HISTORY — PX: ERCP: SHX5425

## 2017-06-12 LAB — HIV ANTIBODY (ROUTINE TESTING W REFLEX): HIV Screen 4th Generation wRfx: NONREACTIVE

## 2017-06-12 LAB — COMPREHENSIVE METABOLIC PANEL
ALBUMIN: 2.4 g/dL — AB (ref 3.5–5.0)
ALT: 128 U/L — ABNORMAL HIGH (ref 17–63)
ANION GAP: 8 (ref 5–15)
AST: 94 U/L — ABNORMAL HIGH (ref 15–41)
Alkaline Phosphatase: 272 U/L — ABNORMAL HIGH (ref 38–126)
BILIRUBIN TOTAL: 5.3 mg/dL — AB (ref 0.3–1.2)
BUN: 34 mg/dL — ABNORMAL HIGH (ref 6–20)
CHLORIDE: 107 mmol/L (ref 101–111)
CO2: 25 mmol/L (ref 22–32)
Calcium: 7.6 mg/dL — ABNORMAL LOW (ref 8.9–10.3)
Creatinine, Ser: 1.44 mg/dL — ABNORMAL HIGH (ref 0.61–1.24)
GFR calc Af Amer: 58 mL/min — ABNORMAL LOW (ref >60)
GFR calc non Af Amer: 50 mL/min — ABNORMAL LOW (ref >60)
Glucose, Bld: 157 mg/dL — ABNORMAL HIGH (ref 65–99)
POTASSIUM: 3.3 mmol/L — AB (ref 3.5–5.1)
Sodium: 140 mmol/L (ref 135–145)
TOTAL PROTEIN: 6 g/dL — AB (ref 6.5–8.1)

## 2017-06-12 LAB — GLUCOSE, CAPILLARY
GLUCOSE-CAPILLARY: 115 mg/dL — AB (ref 65–99)
GLUCOSE-CAPILLARY: 122 mg/dL — AB (ref 65–99)
GLUCOSE-CAPILLARY: 148 mg/dL — AB (ref 65–99)
GLUCOSE-CAPILLARY: 153 mg/dL — AB (ref 65–99)
GLUCOSE-CAPILLARY: 171 mg/dL — AB (ref 65–99)
Glucose-Capillary: 179 mg/dL — ABNORMAL HIGH (ref 65–99)

## 2017-06-12 LAB — URINE CULTURE

## 2017-06-12 LAB — CBC WITH DIFFERENTIAL/PLATELET
BASOS ABS: 0 10*3/uL (ref 0–0.1)
Basophils Relative: 0 %
EOS ABS: 0 10*3/uL (ref 0–0.7)
EOS PCT: 0 %
HEMATOCRIT: 29.4 % — AB (ref 40.0–52.0)
Hemoglobin: 10.2 g/dL — ABNORMAL LOW (ref 13.0–18.0)
Lymphocytes Relative: 2 %
Lymphs Abs: 0.3 10*3/uL — ABNORMAL LOW (ref 1.0–3.6)
MCH: 30.8 pg (ref 26.0–34.0)
MCHC: 34.6 g/dL (ref 32.0–36.0)
MCV: 89 fL (ref 80.0–100.0)
MONO ABS: 0.4 10*3/uL (ref 0.2–1.0)
Monocytes Relative: 3 %
NEUTROS ABS: 11.4 10*3/uL — AB (ref 1.4–6.5)
Neutrophils Relative %: 95 %
PLATELETS: 238 10*3/uL (ref 150–440)
RBC: 3.31 MIL/uL — ABNORMAL LOW (ref 4.40–5.90)
RDW: 14.1 % (ref 11.5–14.5)
WBC: 12.1 10*3/uL — ABNORMAL HIGH (ref 3.8–10.6)

## 2017-06-12 SURGERY — ERCP, WITH INTERVENTION IF INDICATED
Anesthesia: General

## 2017-06-12 MED ORDER — FENTANYL CITRATE (PF) 100 MCG/2ML IJ SOLN
INTRAMUSCULAR | Status: AC
  Filled 2017-06-12: qty 2

## 2017-06-12 MED ORDER — MIDAZOLAM HCL 2 MG/2ML IJ SOLN
INTRAMUSCULAR | Status: DC | PRN
  Administered 2017-06-12: 2 mg via INTRAVENOUS

## 2017-06-12 MED ORDER — LIDOCAINE HCL (PF) 2 % IJ SOLN
INTRAMUSCULAR | Status: AC
  Filled 2017-06-12: qty 2

## 2017-06-12 MED ORDER — MIDAZOLAM HCL 2 MG/2ML IJ SOLN
INTRAMUSCULAR | Status: AC
  Filled 2017-06-12: qty 2

## 2017-06-12 MED ORDER — FENTANYL CITRATE (PF) 100 MCG/2ML IJ SOLN
INTRAMUSCULAR | Status: DC | PRN
  Administered 2017-06-12 (×2): 50 ug via INTRAVENOUS

## 2017-06-12 MED ORDER — GLYCOPYRROLATE 0.2 MG/ML IJ SOLN
INTRAMUSCULAR | Status: AC
  Filled 2017-06-12: qty 1

## 2017-06-12 MED ORDER — PROPOFOL 500 MG/50ML IV EMUL
INTRAVENOUS | Status: DC | PRN
  Administered 2017-06-12: 120 ug/kg/min via INTRAVENOUS

## 2017-06-12 MED ORDER — INDOMETHACIN 50 MG RE SUPP
100.0000 mg | Freq: Once | RECTAL | Status: AC
  Administered 2017-06-12: 100 mg via RECTAL
  Filled 2017-06-12: qty 2

## 2017-06-12 MED ORDER — SODIUM CHLORIDE 0.9 % IV SOLN
INTRAVENOUS | Status: DC
  Administered 2017-06-12: 14:00:00 via INTRAVENOUS

## 2017-06-12 MED ORDER — IBUPROFEN 400 MG PO TABS
600.0000 mg | ORAL_TABLET | Freq: Once | ORAL | Status: AC
  Administered 2017-06-12: 600 mg via ORAL
  Filled 2017-06-12: qty 2

## 2017-06-12 MED ORDER — LIDOCAINE HCL (CARDIAC) 20 MG/ML IV SOLN
INTRAVENOUS | Status: DC | PRN
  Administered 2017-06-12: 30 mg via INTRAVENOUS

## 2017-06-12 MED ORDER — PROPOFOL 500 MG/50ML IV EMUL
INTRAVENOUS | Status: AC
  Filled 2017-06-12: qty 50

## 2017-06-12 MED ORDER — ENOXAPARIN SODIUM 40 MG/0.4ML ~~LOC~~ SOLN
40.0000 mg | SUBCUTANEOUS | Status: DC
  Administered 2017-06-12 – 2017-06-14 (×3): 40 mg via SUBCUTANEOUS
  Filled 2017-06-12 (×3): qty 0.4

## 2017-06-12 NOTE — Op Note (Signed)
[facility] Gastroenterology Patient Name: Randy ReiningLeslie Nelson Procedure Date: 06/12/2017 1:55 PM MRN: [id number] Account #: [id number] Date of Birth: [birth year] Admit Type: Inpatient Age: [age] Room: [facility] ENDO ROOM 4 Gender: Male Note Status: Finalized Procedure:            ERCP Indications:          Jaundice, Elevated bilirubin Providers:            Midge Miniumarren Amaad Byers MD, MD Referring MD:         Illene LabradorJames P. Manor, MD (Referring MD) Medicines:            Propofol per Anesthesia Complications:        No immediate complications. Procedure:            Pre-Anesthesia Assessment:                       - Prior to the procedure, a History and Physical was                        performed, and patient medications and allergies were                        reviewed. The patient's tolerance of previous                        anesthesia was also reviewed. The risks and benefits of                        the procedure and the sedation options and risks were                        discussed with the patient. All questions were                        answered, and informed consent was obtained. Prior                        Anticoagulants: The patient has taken no previous                        anticoagulant or antiplatelet agents. ASA Grade                        Assessment: II - A patient with mild systemic disease.                        After reviewing the risks and benefits, the patient was                        deemed in satisfactory condition to undergo the                        procedure.                       After obtaining informed consent, the scope was passed                        under direct vision. Throughout the procedure, the  patient's blood pressure, pulse, and oxygen saturations                        were monitored continuously. The ERCP was introduced                        through the mouth, and used to inject contrast into and       used to inject contrast into the bile duct. The ERCP                        was accomplished without difficulty. The patient                        tolerated the procedure well. Findings:      The scout film was normal. The esophagus was successfully intubated       under direct vision. The scope was advanced to a normal major papilla in       the descending duodenum without detailed examination of the pharynx,       larynx and associated structures, and upper GI tract. The upper GI tract       was grossly normal. The bile duct was deeply cannulated with the       short-nosed traction sphincterotome. Contrast was injected. I personally       interpreted the bile duct images. There was brisk flow of contrast       through the ducts. Image quality was excellent. Contrast extended to the       entire biliary tree. A wire was passed into the biliary tree. A 9 mm       biliary sphincterotomy was made with a traction (standard)       sphincterotome using ERBE electrocautery. There was no       post-sphincterotomy bleeding. The biliary tree was swept with a 15 mm       balloon starting at the bifurcation. Two stones were removed. No stones       remained. Impression:           - Choledocholithiasis was found. Complete removal was                        accomplished by biliary sphincterotomy and balloon                        extraction.                       - A biliary sphincterotomy was performed.                       - The biliary tree was swept. Recommendation:       - Watch for pancreatitis, bleeding, perforation, and                        cholangitis. Procedure Code(s):    --- Professional ---                       [phone], Endoscopic retrograde cholangiopancreatography                        (ERCP); with removal of calculi/debris from  biliary/pancreatic duct(s)                       [id number], Endoscopic retrograde cholangiopancreatography                         (ERCP); with sphincterotomy/papillotomy                       [phone], Endoscopic catheterization of the biliary ductal                        system, radiological supervision and interpretation Diagnosis Code(s):    --- Professional ---                       K80.50, Calculus of bile duct without cholangitis or                        cholecystitis without obstruction                       R17, Unspecified jaundice                       E80.7, Disorder of bilirubin metabolism, unspecified CPT copyright 2016 [employer]. All rights reserved. The codes documented in this report are preliminary and upon coder review may  be revised to meet current compliance requirements. Midge Minium MD, MD 06/12/2017 2:33:11 PM This report has been signed electronically. Number of Addenda: 0 Note Initiated On: 06/12/2017 1:55 PM      [facility]

## 2017-06-12 NOTE — Progress Notes (Signed)
Reviewed the medical records regarding his previous care including his implantable loop recorder. Patient had history of syncope and [facility] [place] in [place]/or syncopal workup including neurological and cardiac source. at that time his workup including stress test was negative but he had  meditronic LINQ to loop recorder placed on February 13./2016. He had follow up with cardio in one week and reportedly  No abnormality is  Found patient does not  Phone number or name  The  cardiology name. But the life expectancy of the loop recorder is 3 years which will be till feb 2019.

## 2017-06-12 NOTE — Anesthesia Post-op Follow-up Note (Signed)
Anesthesia QCDR form completed.        

## 2017-06-12 NOTE — OR Nursing (Signed)
Report to [facility]  Caleen JobsJ Kamaryn Grimley RN

## 2017-06-12 NOTE — Anesthesia Procedure Notes (Signed)
Performed by: COOK-MARTIN, Asier Desroches Pre-anesthesia Checklist: Patient identified, Emergency Drugs available, Suction available, Patient being monitored and Timeout performed Patient Re-evaluated:Patient Re-evaluated prior to induction Oxygen Delivery Method: Nasal cannula Preoxygenation: Pre-oxygenation with 100% oxygen Induction Type: IV induction Airway Equipment and Method: Bite block Placement Confirmation: CO2 detector and positive ETCO2       

## 2017-06-12 NOTE — Anesthesia Preprocedure Evaluation (Signed)
Anesthesia Evaluation  Patient identified by MRN, date of birth, ID band Patient awake    Reviewed: Allergy & Precautions, NPO status , Patient's Chart, lab work & pertinent test results  Airway Mallampati: II       Dental  (+) Teeth Intact   Pulmonary neg pulmonary ROS,     + decreased breath sounds      Cardiovascular Exercise Tolerance: Good hypertension, Pt. on medications  Rhythm:Regular Rate:Normal     Neuro/Psych negative neurological ROS     GI/Hepatic negative GI ROS, (+) Hepatitis -, B  Endo/Other  diabetes, Type 1, Insulin DependentMorbid obesity  Renal/GU negative Renal ROS     Musculoskeletal negative musculoskeletal ROS (+)   Abdominal (+) + obese,   Peds negative pediatric ROS (+)  Hematology   Anesthesia Other Findings   Reproductive/Obstetrics                             Anesthesia Physical Anesthesia Plan  ASA: III  Anesthesia Plan: General   Post-op Pain Management:    Induction:   PONV Risk Score and Plan:   Airway Management Planned: Natural Airway and Nasal Cannula  Additional Equipment:   Intra-op Plan:   Post-operative Plan:   Informed Consent: I have reviewed the patients History and Physical, chart, labs and discussed the procedure including the risks, benefits and alternatives for the proposed anesthesia with the patient or authorized representative who has indicated his/her understanding and acceptance.     Plan Discussed with: CRNA  Anesthesia Plan Comments:         Anesthesia Quick Evaluation

## 2017-06-12 NOTE — Transfer of Care (Signed)
   Immediate Anesthesia Transfer of Care Note  Patient: Randy Nelson  Procedure(s) Performed: Procedure(s): jaundice (N/A)  Patient Location: PACU  Anesthesia Type:General  Level of Consciousness: awake and sedated  Airway & Oxygen Therapy: Patient Spontanous Breathing and Patient connected to nasal cannula oxygen  Post-op Assessment: Report given to RN and Post -op Vital signs reviewed and stable  Post vital signs: Reviewed and stable  Last Vitals:  Vitals:   06/12/17 0845 06/12/17 1229  BP: (!) 145/51 (!) 136/56  Pulse: 62 60  Resp: 14 16  Temp: 36.8 C 36.6 C  SpO2: 98% 97%    Last Pain:  Vitals:   06/12/17 1229  TempSrc: Tympanic  PainSc: 4       Patients Stated Pain Goal: 0 (06/12/17 1040)  Complications: No apparent anesthesia complications

## 2017-06-12 NOTE — Progress Notes (Signed)
Nurse paged [facility] and asked [place] to see a pt who wanted to complete AD. CH met with pt and pt's wife at bedside. Pt had already complete the AD. CH reviewed the AD with pt and contacted [place] and [facility] [employer] and had pt complete AD. CH gave original and 2 copies to the pt, and a copy was placed in pt's chart.

## 2017-06-12 NOTE — Consult Note (Signed)
[facility] Infectious Disease     Reason for Consult: Bacteremia, cholangitis   Referring Physician: Governor Specking Date of Admission:  06/10/2017   Active Problems:   Cholecystitis   Hyperbilirubinemia   Calculus of common duct without obstruction   Elevated bilirubin  HPI: Randy Nelson is a 61 y.o. male admitted with 4 days nausea, vomiting and abd pain, and on admit febrile to 102.4, wbc 14. He has USS with cholecystitis and elevated LFTs. Underwent ERCP 9/6 with removal of stones. Feels a little better and fevers resolved.   Past Medical History:  Diagnosis Date  . Diabetes mellitus without complication ([place])   . Hypertension    Past Surgical History:  Procedure Laterality Date  . HERNIA REPAIR    . TONSILLECTOMY     Social History  Substance Use Topics  . Smoking status: Never Smoker  . Smokeless tobacco: Never Used  . Alcohol use No   Family History  Problem Relation Age of Onset  . Diabetes Father   . Diabetes Brother     Allergies:  Allergies  Allergen Reactions  . Morphine And Related Other (See Comments)    Chest tightness  . Sulfa Antibiotics Other (See Comments)    Family history of allergy, mostly rashes. No anaphylaxis reported.    Current antibiotics: Antibiotics Given (last 72 hours)    Date/Time Action Medication Dose Rate   06/10/17 1828 New Bag/Given   piperacillin-tazobactam (ZOSYN) IVPB 3.375 g 3.375 g 100 mL/hr   06/11/17 0148 New Bag/Given   [MAR Hold] piperacillin-tazobactam (ZOSYN) IVPB 3.375 g (MAR Hold since 06/12/17 1210) 3.375 g 12.5 mL/hr   06/11/17 1102 New Bag/Given   [MAR Hold] piperacillin-tazobactam (ZOSYN) IVPB 3.375 g (MAR Hold since 06/12/17 1210) 3.375 g 12.5 mL/hr   06/11/17 1734 New Bag/Given   [MAR Hold] piperacillin-tazobactam (ZOSYN) IVPB 3.375 g (MAR Hold since 06/12/17 1210) 3.375 g 12.5 mL/hr   06/12/17 0127 New Bag/Given   [MAR Hold] piperacillin-tazobactam (ZOSYN) IVPB 3.375 g (MAR Hold since 06/12/17  1210) 3.375 g 12.5 mL/hr   06/12/17 0932 New Bag/Given   [MAR Hold] piperacillin-tazobactam (ZOSYN) IVPB 3.375 g (MAR Hold since 06/12/17 1210) 3.375 g 12.5 mL/hr      MEDICATIONS: . [NWG Hold] cyclobenzaprine  5 mg Oral QHS  . enoxaparin (LOVENOX) injection  40 mg Subcutaneous Q24H  . [MAR Hold] insulin aspart  0-20 Units Subcutaneous Q4H  . [MAR Hold] insulin detemir  25 Units Subcutaneous BID  . [MAR Hold] lisinopril  40 mg Oral Daily  . [MAR Hold] sodium chloride flush  3 mL Intravenous Q12H    Review of Systems - 11 systems reviewed and negative per HPI   OBJECTIVE: Temp:  [96.1 F (35.6 C)-102.5 F (39.2 C)] 96.1 F (35.6 C) (09/06 1430) Pulse Rate:  [60-98] 64 (09/06 1500) Resp:  [9-20] 9 (09/06 1500) BP: (119-151)/(45-67) 132/60 (09/06 1500) SpO2:  [93 %-98 %] 96 % (09/06 1500) Physical Exam  Constitutional: He is oriented to person, place, and time.obese  HENT: jaundiced Mouth/Throat: Oropharynx is clear and moist. No oropharyngeal exudate.  Cardiovascular: Normal rate, regular rhythm and normal heart sounds.  Pulmonary/Chest: Effort normal and breath sounds normal. No respiratory distress. He has no wheezes.  Abdominal: Soft. Obese, mild diffuse ttp Bowel sounds are normal.  Lymphadenopathy: He has no cervical adenopathy.  Neurological: He is alert and oriented to person, place, and time.  Skin: Skin is warm and dry. No rash noted. No erythema.  Psychiatric: He has  a normal mood and affect. His behavior is normal.     LABS: Results for orders placed or performed during the hospital encounter of 06/10/17 (from the past 48 hour(s))  Urinalysis, Complete w Microscopic     Status: Abnormal   Collection Time: 06/10/17  3:53 PM  Result Value Ref Range   Color, Urine AMBER (A) YELLOW    Comment: BIOCHEMICALS MAY BE AFFECTED BY COLOR   APPearance CLOUDY (A) CLEAR   Specific Gravity, Urine 1.026 1.005 - 1.030   pH 5.0 5.0 - 8.0   Glucose, UA 50 (A) NEGATIVE mg/dL    Hgb urine dipstick SMALL (A) NEGATIVE   Bilirubin Urine NEGATIVE NEGATIVE   Ketones, ur NEGATIVE NEGATIVE mg/dL   Protein, ur 100 (A) NEGATIVE mg/dL   Nitrite NEGATIVE NEGATIVE   Leukocytes, UA NEGATIVE NEGATIVE   RBC / HPF 0-5 0 - 5 RBC/hpf   WBC, UA 6-30 0 - 5 WBC/hpf   Bacteria, UA FEW (A) NONE SEEN   Squamous Epithelial / LPF 0-5 (A) NONE SEEN   Mucus PRESENT    Hyaline Casts, UA PRESENT    Amorphous Crystal PRESENT   Urine culture     Status: Abnormal   Collection Time: 06/10/17  3:53 PM  Result Value Ref Range   Specimen Description URINE, RANDOM    Special Requests NONE    Culture (A)     <10,000 COLONIES/mL INSIGNIFICANT GROWTH Performed at [facility], [address] [place], [place] [address]    Report Status 06/12/2017 FINAL   Comprehensive metabolic panel     Status: Abnormal   Collection Time: 06/10/17  4:14 PM  Result Value Ref Range   Sodium 136 135 - 145 mmol/L   Potassium 3.1 (L) 3.5 - 5.1 mmol/L   Chloride 100 (L) 101 - 111 mmol/L   CO2 23 22 - 32 mmol/L   Glucose, Bld 221 (H) 65 - 99 mg/dL   BUN 23 (H) 6 - 20 mg/dL   Creatinine, Ser 1.13 0.61 - 1.24 mg/dL   Calcium 8.3 (L) 8.9 - 10.3 mg/dL   Total Protein 6.9 6.5 - 8.1 g/dL   Albumin 2.9 (L) 3.5 - 5.0 g/dL   AST 228 (H) 15 - 41 U/L   ALT 97 (H) 17 - 63 U/L   Alkaline Phosphatase 362 (H) 38 - 126 U/L   Total Bilirubin 3.4 (H) 0.3 - 1.2 mg/dL   GFR calc non Af Amer >60 >60 mL/min   GFR calc Af Amer >60 >60 mL/min    Comment: (NOTE) The eGFR has been calculated using the CKD EPI equation. This calculation has not been validated in all clinical situations. eGFR's persistently <60 mL/min signify possible Chronic Kidney Disease.    Anion gap 13 5 - 15  CBC WITH DIFFERENTIAL     Status: Abnormal   Collection Time: 06/10/17  4:14 PM  Result Value Ref Range   WBC 12.2 (H) 3.8 - 10.6 K/uL   RBC 3.81 (L) 4.40 - 5.90 MIL/uL   Hemoglobin 11.7 (L) 13.0 - 18.0 g/dL   HCT 34.0 (L) 40.0 - 52.0 %    MCV 89.1 80.0 - 100.0 fL   MCH 30.8 26.0 - 34.0 pg   MCHC 34.5 32.0 - 36.0 g/dL   RDW 13.6 11.5 - 14.5 %   Platelets 310 150 - 440 K/uL   Neutrophils Relative % 95 %   Neutro Abs 11.6 (H) 1.4 - 6.5 K/uL   Lymphocytes Relative 2 %  Lymphs Abs 0.3 (L) 1.0 - 3.6 K/uL   Monocytes Relative 3 %   Monocytes Absolute 0.3 0.2 - 1.0 K/uL   Eosinophils Relative 0 %   Eosinophils Absolute 0.0 0 - 0.7 K/uL   Basophils Relative 0 %   Basophils Absolute 0.0 0 - 0.1 K/uL  Blood Culture (routine x 2)     Status: Abnormal (Preliminary result)   Collection Time: 06/10/17  4:14 PM  Result Value Ref Range   Specimen Description BLOOD BLOOD RIGHT HAND    Special Requests      BOTTLES DRAWN AEROBIC AND ANAEROBIC Blood Culture results may not be optimal due to an excessive volume of blood received in culture bottles   Culture  Setup Time      GRAM NEGATIVE RODS IN BOTH AEROBIC AND ANAEROBIC BOTTLES CRITICAL RESULT CALLED TO, READ BACK BY AND VERIFIED WITH: KAREN HAYES AT [facility] ON 06/11/17 [place].    Culture (A)     KLEBSIELLA PNEUMONIAE SUSCEPTIBILITIES TO FOLLOW Performed at [facility] [address] [place], [place] [address]    Report Status PENDING   Blood Culture (routine x 2)     Status: Abnormal (Preliminary result)   Collection Time: 06/10/17  4:14 PM  Result Value Ref Range   Specimen Description BLOOD BLOOD LEFT HAND    Special Requests      BOTTLES DRAWN AEROBIC AND ANAEROBIC Blood Culture results may not be optimal due to an excessive volume of blood received in culture bottles   Culture  Setup Time      GRAM NEGATIVE RODS IN BOTH AEROBIC AND ANAEROBIC BOTTLES CRITICAL VALUE NOTED.  VALUE IS CONSISTENT WITH PREVIOUSLY REPORTED AND CALLED VALUE.    Culture KLEBSIELLA PNEUMONIAE (A)    Report Status PENDING   Lactic acid, plasma     Status: None   Collection Time: 06/10/17  4:14 PM  Result Value Ref Range   Lactic Acid, Venous 1.8 0.5 - 1.9 mmol/L  Troponin I     Status:  Abnormal   Collection Time: 06/10/17  4:14 PM  Result Value Ref Range   Troponin I 0.06 (HH) <0.03 ng/mL    Comment: CRITICAL RESULT CALLED TO, READ BACK BY AND VERIFIED WITH TERESA CLAPP AT 1654 ON 06/10/2017 [facility]   Blood Culture ID Panel (Reflexed)     Status: Abnormal   Collection Time: 06/10/17  4:14 PM  Result Value Ref Range   Enterococcus species NOT DETECTED NOT DETECTED   Listeria monocytogenes NOT DETECTED NOT DETECTED   Staphylococcus species NOT DETECTED NOT DETECTED   Staphylococcus aureus NOT DETECTED NOT DETECTED   Streptococcus species NOT DETECTED NOT DETECTED   Streptococcus agalactiae NOT DETECTED NOT DETECTED   Streptococcus pneumoniae NOT DETECTED NOT DETECTED   Streptococcus pyogenes NOT DETECTED NOT DETECTED   Acinetobacter baumannii NOT DETECTED NOT DETECTED   Enterobacteriaceae species DETECTED (A) NOT DETECTED    Comment: Enterobacteriaceae represent a large family of gram-negative bacteria, not a single organism. CRITICAL RESULT CALLED TO, READ BACK BY AND VERIFIED WITH: KAREN HAYES AT 7628 ON 06/11/17 [place].    Enterobacter cloacae complex NOT DETECTED NOT DETECTED   Escherichia coli NOT DETECTED NOT DETECTED   Klebsiella oxytoca NOT DETECTED NOT DETECTED   Klebsiella pneumoniae DETECTED (A) NOT DETECTED    Comment: CRITICAL RESULT CALLED TO, READ BACK BY AND VERIFIED WITH: KAREN HAYES AT 0743 ON 06/11/17 [place].    Proteus species NOT DETECTED NOT DETECTED   Serratia marcescens NOT  DETECTED NOT DETECTED   Carbapenem resistance NOT DETECTED NOT DETECTED   Haemophilus influenzae NOT DETECTED NOT DETECTED   Neisseria meningitidis NOT DETECTED NOT DETECTED   Pseudomonas aeruginosa NOT DETECTED NOT DETECTED   Candida albicans NOT DETECTED NOT DETECTED   Candida glabrata NOT DETECTED NOT DETECTED   Candida krusei NOT DETECTED NOT DETECTED   Candida parapsilosis NOT DETECTED NOT DETECTED   Candida tropicalis NOT DETECTED NOT DETECTED  Glucose, capillary     Status:  Abnormal   Collection Time: 06/10/17 10:05 PM  Result Value Ref Range   Glucose-Capillary 274 (H) 65 - 99 mg/dL  HIV antibody (Routine Testing)     Status: None   Collection Time: 06/10/17 10:52 PM  Result Value Ref Range   HIV Screen 4th Generation wRfx Non Reactive Non Reactive    Comment: (NOTE) Performed At: [facility] [place], [place] [id number] Lindon Romp MD [id number]   Troponin I (q 6hr x 3)     Status: Abnormal   Collection Time: 06/10/17 10:52 PM  Result Value Ref Range   Troponin I 0.23 (HH) <0.03 ng/mL    Comment: CRITICAL VALUE NOTED. VALUE IS CONSISTENT WITH PREVIOUSLY REPORTED/CALLED VALUE ALV  Glucose, capillary     Status: Abnormal   Collection Time: 06/11/17 12:18 AM  Result Value Ref Range   Glucose-Capillary 277 (H) 65 - 99 mg/dL  Glucose, capillary     Status: Abnormal   Collection Time: 06/11/17  3:58 AM  Result Value Ref Range   Glucose-Capillary 197 (H) 65 - 99 mg/dL  Comprehensive metabolic panel     Status: Abnormal   Collection Time: 06/11/17  5:06 AM  Result Value Ref Range   Sodium 139 135 - 145 mmol/L   Potassium 3.7 3.5 - 5.1 mmol/L   Chloride 103 101 - 111 mmol/L   CO2 26 22 - 32 mmol/L   Glucose, Bld 211 (H) 65 - 99 mg/dL   BUN 32 (H) 6 - 20 mg/dL   Creatinine, Ser 1.54 (H) 0.61 - 1.24 mg/dL   Calcium 7.9 (L) 8.9 - 10.3 mg/dL   Total Protein 6.2 (L) 6.5 - 8.1 g/dL   Albumin 2.6 (L) 3.5 - 5.0 g/dL   AST 201 (H) 15 - 41 U/L   ALT 177 (H) 17 - 63 U/L   Alkaline Phosphatase 295 (H) 38 - 126 U/L   Total Bilirubin 5.1 (H) 0.3 - 1.2 mg/dL   GFR calc non Af Amer 46 (L) >60 mL/min   GFR calc Af Amer 54 (L) >60 mL/min    Comment: (NOTE) The eGFR has been calculated using the CKD EPI equation. This calculation has not been validated in all clinical situations. eGFR's persistently <60 mL/min signify possible Chronic Kidney Disease.    Anion gap 10 5 - 15  CBC     Status: Abnormal   Collection Time: 06/11/17   5:06 AM  Result Value Ref Range   WBC 14.8 (H) 3.8 - 10.6 K/uL   RBC 3.59 (L) 4.40 - 5.90 MIL/uL   Hemoglobin 10.9 (L) 13.0 - 18.0 g/dL   HCT 32.3 (L) 40.0 - 52.0 %   MCV 89.8 80.0 - 100.0 fL   MCH 30.4 26.0 - 34.0 pg   MCHC 33.9 32.0 - 36.0 g/dL   RDW 13.7 11.5 - 14.5 %   Platelets 274 150 - 440 K/uL  Protime-INR     Status: Abnormal   Collection Time: 06/11/17  5:06 AM  Result Value Ref Range   Prothrombin Time 16.8 (H) 11.4 - 15.2 seconds   INR 1.37   Troponin I (q 6hr x 3)     Status: Abnormal   Collection Time: 06/11/17  5:06 AM  Result Value Ref Range   Troponin I 0.11 (HH) <0.03 ng/mL    Comment: CRITICAL VALUE NOTED. VALUE IS CONSISTENT WITH PREVIOUSLY REPORTED/CALLED VALUE ALV  Glucose, capillary     Status: Abnormal   Collection Time: 06/11/17  7:50 AM  Result Value Ref Range   Glucose-Capillary 191 (H) 65 - 99 mg/dL  Glucose, capillary     Status: Abnormal   Collection Time: 06/11/17 11:38 AM  Result Value Ref Range   Glucose-Capillary 164 (H) 65 - 99 mg/dL  Glucose, capillary     Status: Abnormal   Collection Time: 06/11/17  4:56 PM  Result Value Ref Range   Glucose-Capillary 200 (H) 65 - 99 mg/dL  Glucose, capillary     Status: Abnormal   Collection Time: 06/11/17  8:04 PM  Result Value Ref Range   Glucose-Capillary 303 (H) 65 - 99 mg/dL  Glucose, capillary     Status: Abnormal   Collection Time: 06/12/17 12:46 AM  Result Value Ref Range   Glucose-Capillary 171 (H) 65 - 99 mg/dL  CBC with Differential/Platelet     Status: Abnormal   Collection Time: 06/12/17  3:18 AM  Result Value Ref Range   WBC 12.1 (H) 3.8 - 10.6 K/uL   RBC 3.31 (L) 4.40 - 5.90 MIL/uL   Hemoglobin 10.2 (L) 13.0 - 18.0 g/dL   HCT 29.4 (L) 40.0 - 52.0 %   MCV 89.0 80.0 - 100.0 fL   MCH 30.8 26.0 - 34.0 pg   MCHC 34.6 32.0 - 36.0 g/dL   RDW 14.1 11.5 - 14.5 %   Platelets 238 150 - 440 K/uL   Neutrophils Relative % 95 %   Neutro Abs 11.4 (H) 1.4 - 6.5 K/uL   Lymphocytes Relative 2 %    Lymphs Abs 0.3 (L) 1.0 - 3.6 K/uL   Monocytes Relative 3 %   Monocytes Absolute 0.4 0.2 - 1.0 K/uL   Eosinophils Relative 0 %   Eosinophils Absolute 0.0 0 - 0.7 K/uL   Basophils Relative 0 %   Basophils Absolute 0.0 0 - 0.1 K/uL  Comprehensive metabolic panel     Status: Abnormal   Collection Time: 06/12/17  3:18 AM  Result Value Ref Range   Sodium 140 135 - 145 mmol/L   Potassium 3.3 (L) 3.5 - 5.1 mmol/L   Chloride 107 101 - 111 mmol/L   CO2 25 22 - 32 mmol/L   Glucose, Bld 157 (H) 65 - 99 mg/dL   BUN 34 (H) 6 - 20 mg/dL   Creatinine, Ser 1.44 (H) 0.61 - 1.24 mg/dL   Calcium 7.6 (L) 8.9 - 10.3 mg/dL   Total Protein 6.0 (L) 6.5 - 8.1 g/dL   Albumin 2.4 (L) 3.5 - 5.0 g/dL   AST 94 (H) 15 - 41 U/L   ALT 128 (H) 17 - 63 U/L   Alkaline Phosphatase 272 (H) 38 - 126 U/L   Total Bilirubin 5.3 (H) 0.3 - 1.2 mg/dL   GFR calc non Af Amer 50 (L) >60 mL/min   GFR calc Af Amer 58 (L) >60 mL/min    Comment: (NOTE) The eGFR has been calculated using the CKD EPI equation. This calculation has not been validated in all clinical situations. eGFR's persistently <60 mL/min  signify possible Chronic Kidney Disease.    Anion gap 8 5 - 15  Glucose, capillary     Status: Abnormal   Collection Time: 06/12/17  4:10 AM  Result Value Ref Range   Glucose-Capillary 148 (H) 65 - 99 mg/dL  Glucose, capillary     Status: Abnormal   Collection Time: 06/12/17  7:44 AM  Result Value Ref Range   Glucose-Capillary 122 (H) 65 - 99 mg/dL   Comment 1 Notify RN   Glucose, capillary     Status: Abnormal   Collection Time: 06/12/17 11:52 AM  Result Value Ref Range   Glucose-Capillary 115 (H) 65 - 99 mg/dL   Comment 1 Notify RN    No components found for: ESR, C REACTIVE PROTEIN MICRO: Recent Results (from the past 720 hour(s))  Urine culture     Status: Abnormal   Collection Time: 06/10/17  3:53 PM  Result Value Ref Range Status   Specimen Description URINE, RANDOM  Final   Special Requests NONE  Final    Culture (A)  Final    <10,000 COLONIES/mL INSIGNIFICANT GROWTH Performed at [facility], [address] [place], [place] [address]    Report Status 06/12/2017 FINAL  Final  Blood Culture (routine x 2)     Status: Abnormal (Preliminary result)   Collection Time: 06/10/17  4:14 PM  Result Value Ref Range Status   Specimen Description BLOOD BLOOD RIGHT HAND  Final   Special Requests   Final    BOTTLES DRAWN AEROBIC AND ANAEROBIC Blood Culture results may not be optimal due to an excessive volume of blood received in culture bottles   Culture  Setup Time   Final    GRAM NEGATIVE RODS IN BOTH AEROBIC AND ANAEROBIC BOTTLES CRITICAL RESULT CALLED TO, READ BACK BY AND VERIFIED WITH: KAREN HAYES AT 6045 ON 06/11/17 [place].    Culture (A)  Final    KLEBSIELLA PNEUMONIAE SUSCEPTIBILITIES TO FOLLOW Performed at [facility], [place] [address] [place], [place] [address]    Report Status PENDING  Incomplete  Blood Culture (routine x 2)     Status: Abnormal (Preliminary result)   Collection Time: 06/10/17  4:14 PM  Result Value Ref Range Status   Specimen Description BLOOD BLOOD LEFT HAND  Final   Special Requests   Final    BOTTLES DRAWN AEROBIC AND ANAEROBIC Blood Culture results may not be optimal due to an excessive volume of blood received in culture bottles   Culture  Setup Time   Final    GRAM NEGATIVE RODS IN BOTH AEROBIC AND ANAEROBIC BOTTLES CRITICAL VALUE NOTED.  VALUE IS CONSISTENT WITH PREVIOUSLY REPORTED AND CALLED VALUE.    Culture KLEBSIELLA PNEUMONIAE (A)  Final   Report Status PENDING  Incomplete  Blood Culture ID Panel (Reflexed)     Status: Abnormal   Collection Time: 06/10/17  4:14 PM  Result Value Ref Range Status   Enterococcus species NOT DETECTED NOT DETECTED Final   Listeria monocytogenes NOT DETECTED NOT DETECTED Final   Staphylococcus species NOT DETECTED NOT DETECTED Final   Staphylococcus aureus NOT DETECTED NOT DETECTED Final   Streptococcus  species NOT DETECTED NOT DETECTED Final   Streptococcus agalactiae NOT DETECTED NOT DETECTED Final   Streptococcus pneumoniae NOT DETECTED NOT DETECTED Final   Streptococcus pyogenes NOT DETECTED NOT DETECTED Final   Acinetobacter baumannii NOT DETECTED NOT DETECTED Final   Enterobacteriaceae species DETECTED (A) NOT DETECTED Final    Comment: Enterobacteriaceae  represent a large family of gram-negative bacteria, not a single organism. CRITICAL RESULT CALLED TO, READ BACK BY AND VERIFIED WITH: KAREN HAYES AT 7989 ON 06/11/17 [place].    Enterobacter cloacae complex NOT DETECTED NOT DETECTED Final   Escherichia coli NOT DETECTED NOT DETECTED Final   Klebsiella oxytoca NOT DETECTED NOT DETECTED Final   Klebsiella pneumoniae DETECTED (A) NOT DETECTED Final    Comment: CRITICAL RESULT CALLED TO, READ BACK BY AND VERIFIED WITH: KAREN HAYES AT 0743 ON 06/11/17 [place].    Proteus species NOT DETECTED NOT DETECTED Final   Serratia marcescens NOT DETECTED NOT DETECTED Final   Carbapenem resistance NOT DETECTED NOT DETECTED Final   Haemophilus influenzae NOT DETECTED NOT DETECTED Final   Neisseria meningitidis NOT DETECTED NOT DETECTED Final   Pseudomonas aeruginosa NOT DETECTED NOT DETECTED Final   Candida albicans NOT DETECTED NOT DETECTED Final   Candida glabrata NOT DETECTED NOT DETECTED Final   Candida krusei NOT DETECTED NOT DETECTED Final   Candida parapsilosis NOT DETECTED NOT DETECTED Final   Candida tropicalis NOT DETECTED NOT DETECTED Final    IMAGING: Dg Chest 2 View  Result Date: 06/10/2017 CLINICAL DATA:  Fever, chills, and nausea and vomiting for the past 4 5 days. No shortness of breath or chest pain. History of diabetes and hypertension. Nonsmoker. EXAM: CHEST  2 VIEW COMPARISON:  None in PACs FINDINGS: The lungs are adequately inflated. The interstitial markings are slightly increased. The heart is top-normal in size. The pulmonary vascularity is not engorged. The mediastinum is normal  in width. There is mild prominence of the perihilar lung markings on the left. There is a cardiac rhythm implantable recording device present. The bony thorax exhibits no acute abnormality. IMPRESSION: Mild interstitial prominence suggests bronchitic change. There is mild central pulmonary vascular prominence however. This could reflect low-grade CHF in the appropriate setting. A follow-up PA and lateral chest x-ray with deep inspiratory effort would be useful. Electronically Signed   By: Mahlik Lenn  [place] M.D.   On: 06/10/2017 17:13   Dg C-arm 1-60 Min-no Report  Result Date: 06/12/2017 Fluoroscopy was utilized by the requesting physician.  No radiographic interpretation.   US Abdomen Limited Ruq  Result Date: 06/10/2017 CLINICAL DATA:  Right upper quadrant pain with nausea, vomiting, and fever EXAM: ULTRASOUND ABDOMEN LIMITED RIGHT UPPER QUADRANT COMPARISON:  None. FINDINGS: Gallbladder: Gallbladder appears mildly distended. Within the gallbladder, there are small echogenic foci which move and shadow consistent with cholelithiasis. There is also mild sludge in the gallbladder. Gallbladder wall appears somewhat thickened in portions, particularly anteriorly. There is no pericholecystic fluid. No sonographic Murphy sign noted by sonographer. Common bile duct: Diameter: 4 mm. No intrahepatic or extrahepatic biliary duct dilatation. Liver: No focal lesion identified. Within normal limits in parenchymal echogenicity. Portal vein is patent on color Doppler imaging with normal direction of blood flow towards the liver. IMPRESSION: Gallbladder is distended. Within the gallbladder, there are small gallstones and mild sludge. Areas of gallbladder wall appear mildly thickened but not edematous. This overall appearance is concerning for cholecystitis. It may be prudent to consider nuclear medicine hepatobiliary imaging study to assess cystic duct patency in this circumstance. Study otherwise unremarkable. Electronically  Signed   By: Lowella Grip III M.D.   On: 06/10/2017 18:14    Assessment:   Jovahn Breit is a 61 y.o. male with cholangitis from choledocholithiasis and bacteremia with Klebsiella. He is s/p ERCP 9/6. Fever resolved, wbc down to 13.  LFTs remain elevated, lipase  up some.  Clinically improving but still some abd pain. Will need 10 days of abx for the cholangitis s/p ERCP done 9/6.    Recommendations Change zosyn to unasyn while inpt At dc can change to augmentin until 9/16 stop date.  Can follow up with surgery and PCP.  No need for ID follow up unless worsens. Thank you very much for allowing me to participate in the care of this patient. Please call with questions.   Cheral Marker. Ola Spurr, MD

## 2017-06-12 NOTE — Progress Notes (Signed)
[facility] - [place] at [facility]   PATIENT NAME: Randy Nelson    MR#:  [id number]  DATE OF BIRTH:  Feb 10, 1956  SUBJECTIVE: Patient appears more yellow today//or shortness of breath. Still has abdominal pain, did not tolerate clear liquids, has severe abdominal pain all through the night. Low-grade temperature this morning at 100.7 Fahrenheit.   CHIEF COMPLAINT:   Chief Complaint  Patient presents with  . Fever  . Nausea    REVIEW OF SYSTEMS:   ROS CONSTITUTIONAL: Low-grade temperature this morning.no fatigue or weakness.  EYES: No blurred or double vision.  EARS, NOSE, AND THROAT: No tinnitus or ear pain.  RESPIRATORY: No cough, shortness of breath, wheezing or hemoptysis.  CARDIOVASCULAR: No chest pain, orthopnea, edema.  GASTROINTESTINAL: Still has right upper quadrant abdominal pain, nausea, jaundice. Not Tolerating clear liquids also. GENITOURINARY: No dysuria, hematuria.  ENDOCRINE: No polyuria, nocturia,  HEMATOLOGY: No anemia, easy bruising or bleeding SKIN: No rash or lesion. MUSCULOSKELETAL: No joint pain or arthritis.   NEUROLOGIC: No tingling, numbness, weakness.  PSYCHIATRY: No anxiety or depression.   DRUG ALLERGIES:   Allergies  Allergen Reactions  . Morphine And Related Other (See Comments)    Chest tightness  . Sulfa Antibiotics Other (See Comments)    Family history of allergy, mostly rashes. No anaphylaxis reported.    VITALS:  Blood pressure (!) 145/51, pulse 62, temperature 98.2 F (36.8 C), temperature source Oral, resp. rate 14, height  (1.905 m), weight 110.5 kg (243 lb 8 oz), SpO2 98 %.  PHYSICAL EXAMINATION:  GENERAL:  61 y.o.-year-old patient lying in the bed with no acute distress.  EYES: Pupils equal, round, reactive to light  . Patient has scleral icterus and appears jaundiced. HEENT: Head atraumatic, normocephalic. Oropharynx and nasopharynx clear.  NECK:  Supple, no jugular venous distention. No thyroid  enlargement, no tenderness.  LUNGS: Normal breath sounds bilaterally, no wheezing, rales,rhonchi or crepitation. No use of accessory muscles of respiration.  CARDIOVASCULAR: S1, S2 normal. No murmurs, rubs, or gallops.  ABDOMEN: Mild right upper quadrant tenderness present, nondistended. Bowel sounds present. No organomegaly or mass.  EXTREMITIES: No pedal edema, cyanosis, or clubbing.  NEUROLOGIC: Cranial nerves II through XII are intact. Muscle strength 5/5 in all extremities. Sensation intact. Gait not checked.  PSYCHIATRIC: The patient is alert and oriented x 3.  SKIN: No obvious rash, lesion, or ulcer.    LABORATORY PANEL:   CBC  Recent Labs Lab 06/12/17 0318  WBC 12.1*  HGB 10.2*  HCT 29.4*  PLT 238   ------------------------------------------------------------------------------------------------------------------  Chemistries   Recent Labs Lab 06/12/17 0318  NA 140  K 3.3*  CL 107  CO2 25  GLUCOSE 157*  BUN 34*  CREATININE 1.44*  CALCIUM 7.6*  AST 94*  ALT 128*  ALKPHOS 272*  BILITOT 5.3*   ------------------------------------------------------------------------------------------------------------------  Cardiac Enzymes  Recent Labs Lab 06/11/17 0506  TROPONINI 0.11*   ------------------------------------------------------------------------------------------------------------------  RADIOLOGY:  Dg Chest 2 View  Result Date: 06/10/2017 CLINICAL DATA:  Fever, chills, and nausea and vomiting for the past 4 5 days. No shortness of breath or chest pain. History of diabetes and hypertension. Nonsmoker. EXAM: CHEST  2 VIEW COMPARISON:  None in PACs FINDINGS: The lungs are adequately inflated. The interstitial markings are slightly increased. The heart is top-normal in size. The pulmonary vascularity is not engorged. The mediastinum is normal in width. There is mild prominence of the perihilar lung markings on the left. There is a cardiac rhythm  implantable  recording device present. The bony thorax exhibits no acute abnormality. IMPRESSION: Mild interstitial prominence suggests bronchitic change. There is mild central pulmonary vascular prominence however. This could reflect low-grade CHF in the appropriate setting. A follow-up PA and lateral chest x-ray with deep inspiratory effort would be useful. Electronically Signed   By: David  [place] M.D.   On: 06/10/2017 17:13   US Abdomen Limited Ruq  Result Date: 06/10/2017 CLINICAL DATA:  Right upper quadrant pain with nausea, vomiting, and fever EXAM: ULTRASOUND ABDOMEN LIMITED RIGHT UPPER QUADRANT COMPARISON:  None. FINDINGS: Gallbladder: Gallbladder appears mildly distended. Within the gallbladder, there are small echogenic foci which move and shadow consistent with cholelithiasis. There is also mild sludge in the gallbladder. Gallbladder wall appears somewhat thickened in portions, particularly anteriorly. There is no pericholecystic fluid. No sonographic Murphy sign noted by sonographer. Common bile duct: Diameter: 4 mm. No intrahepatic or extrahepatic biliary duct dilatation. Liver: No focal lesion identified. Within normal limits in parenchymal echogenicity. Portal vein is patent on color Doppler imaging with normal direction of blood flow towards the liver. IMPRESSION: Gallbladder is distended. Within the gallbladder, there are small gallstones and mild sludge. Areas of gallbladder wall appear mildly thickened but not edematous. This overall appearance is concerning for cholecystitis. It may be prudent to consider nuclear medicine hepatobiliary imaging study to assess cystic duct patency in this circumstance. Study otherwise unremarkable. Electronically Signed   By: Bretta Bang III M.D.   On: 06/10/2017 18:14    EKG:   Orders placed or performed during the hospital encounter of 06/10/17  . ED EKG 12-Lead  . ED EKG  . ED EKG 12-Lead  . ED EKG    ASSESSMENT AND PLAN:   #1.acute cholecystitis with  cholangitis: Patient had chills and fever, right upper quadrant pain and nausea and vomiting for last 2 days. Patient is receiving IV fluids, IV antibiotics, blood cultures showed klebsiella , enterococci . Continue Zosyn,. Follow fever curve and sensitivity results. Seen by surgery for acute cholecystitis. Recommended gastroenterology consult. Seen by gastroenterology, scheduled for ERCP today. Patient has persistent jaundice and elevated TB upto 5. Unable to do MRCP because of possibl implantable cardiac device . MRI is unable to clear him for MRCP.  #2 kidney injury with acute renal failure and sepsis continue IV hydration, monitor kidney function, avoid nephrotoxic agents, discontinue metformin. Discussed this with patient. Patient has hypokalemia today, replace the potassium  In  IV fluids.  #3/. slightly elevated troponins likely due to demand ischemia from sepsis. Seen by cardiology.  Echocardiogram showed EF 55%. With normal systolic function.  #4 .obstructive jaundice with elevated alkaline phosphatase, AST, ALT, bilirubin. Continue nothing by mouth, ERCP today. Continue IV fluids.  #5. diabetes mellitus type 2 with hyperglycemia: Better sugar control today, continue Levemir 25 units twice a day, sliding scale insulin with high-dose coverage.  spoke with gastroenterology. Discussed with patient that he is going to have ERCP today. Discussed all the lab results and blood culture results date also. Reviewed the echo results although the patient.   All the records are reviewed and case discussed with Care Management/Social Workerr. Management plans discussed with the patient, family and they are in agreement.  CODE STATUS:full  TOTAL TIME TAKING CARE OF THIS PATIENT:35 minutes.   POSSIBLE D/C IN 1-2 DAYS, DEPENDING ON CLINICAL CONDITION. D/w RN  Katha Hamming M.D on 06/12/2017 at 11:23 AM  Between 7am to 6pm - Pager - [phone]  After 6pm go to [email or web address] - password  [email or web address]  [facility]  [facility]  Office  [phone]  CC: Primary care physician; Eulogio BearManor, James P., MD   Note: This dictation was prepared with Dragon dictation along with smaller phrase technology. Any transcriptional errors that result from this process are unintentional.

## 2017-06-13 ENCOUNTER — Encounter: Payer: Self-pay | Admitting: Gastroenterology

## 2017-06-13 DIAGNOSIS — K8031 Calculus of bile duct with cholangitis, unspecified, with obstruction: Secondary | ICD-10-CM

## 2017-06-13 DIAGNOSIS — R7881 Bacteremia: Secondary | ICD-10-CM

## 2017-06-13 LAB — CBC WITH DIFFERENTIAL/PLATELET
BASOS ABS: 0 10*3/uL (ref 0–0.1)
BASOS PCT: 0 %
EOS PCT: 0 %
Eosinophils Absolute: 0 10*3/uL (ref 0–0.7)
HCT: 28.7 % — ABNORMAL LOW (ref 40.0–52.0)
Hemoglobin: 9.8 g/dL — ABNORMAL LOW (ref 13.0–18.0)
Lymphocytes Relative: 5 %
Lymphs Abs: 0.7 10*3/uL — ABNORMAL LOW (ref 1.0–3.6)
MCH: 30.6 pg (ref 26.0–34.0)
MCHC: 34.3 g/dL (ref 32.0–36.0)
MCV: 89.2 fL (ref 80.0–100.0)
MONO ABS: 0.9 10*3/uL (ref 0.2–1.0)
Monocytes Relative: 7 %
Neutro Abs: 11.7 10*3/uL — ABNORMAL HIGH (ref 1.4–6.5)
Neutrophils Relative %: 88 %
PLATELETS: 203 10*3/uL (ref 150–440)
RBC: 3.21 MIL/uL — AB (ref 4.40–5.90)
RDW: 14.3 % (ref 11.5–14.5)
WBC: 13.3 10*3/uL — ABNORMAL HIGH (ref 3.8–10.6)

## 2017-06-13 LAB — COMPREHENSIVE METABOLIC PANEL
ALBUMIN: 2.2 g/dL — AB (ref 3.5–5.0)
ALT: 92 U/L — ABNORMAL HIGH (ref 17–63)
AST: 58 U/L — AB (ref 15–41)
Alkaline Phosphatase: 258 U/L — ABNORMAL HIGH (ref 38–126)
Anion gap: 7 (ref 5–15)
BUN: 37 mg/dL — AB (ref 6–20)
CO2: 25 mmol/L (ref 22–32)
Calcium: 7.6 mg/dL — ABNORMAL LOW (ref 8.9–10.3)
Chloride: 108 mmol/L (ref 101–111)
Creatinine, Ser: 1.42 mg/dL — ABNORMAL HIGH (ref 0.61–1.24)
GFR calc Af Amer: 59 mL/min — ABNORMAL LOW (ref >60)
GFR calc non Af Amer: 51 mL/min — ABNORMAL LOW (ref >60)
GLUCOSE: 146 mg/dL — AB (ref 65–99)
POTASSIUM: 3.7 mmol/L (ref 3.5–5.1)
SODIUM: 140 mmol/L (ref 135–145)
Total Bilirubin: 5.5 mg/dL — ABNORMAL HIGH (ref 0.3–1.2)
Total Protein: 6.2 g/dL — ABNORMAL LOW (ref 6.5–8.1)

## 2017-06-13 LAB — CULTURE, BLOOD (ROUTINE X 2)

## 2017-06-13 LAB — GLUCOSE, CAPILLARY
GLUCOSE-CAPILLARY: 141 mg/dL — AB (ref 65–99)
GLUCOSE-CAPILLARY: 152 mg/dL — AB (ref 65–99)
GLUCOSE-CAPILLARY: 86 mg/dL (ref 65–99)
Glucose-Capillary: 123 mg/dL — ABNORMAL HIGH (ref 65–99)
Glucose-Capillary: 129 mg/dL — ABNORMAL HIGH (ref 65–99)
Glucose-Capillary: 171 mg/dL — ABNORMAL HIGH (ref 65–99)
Glucose-Capillary: 78 mg/dL (ref 65–99)

## 2017-06-13 LAB — LIPASE, BLOOD: LIPASE: 474 U/L — AB (ref 11–51)

## 2017-06-13 MED ORDER — CYCLOBENZAPRINE HCL 10 MG PO TABS
5.0000 mg | ORAL_TABLET | Freq: Three times a day (TID) | ORAL | Status: DC | PRN
  Administered 2017-06-13: 5 mg via ORAL
  Filled 2017-06-13: qty 1

## 2017-06-13 MED ORDER — SODIUM CHLORIDE 0.9 % IV SOLN
3.0000 g | Freq: Four times a day (QID) | INTRAVENOUS | Status: DC
  Administered 2017-06-13 – 2017-06-15 (×8): 3 g via INTRAVENOUS
  Filled 2017-06-13 (×12): qty 3

## 2017-06-13 MED ORDER — POLYETHYLENE GLYCOL 3350 17 G PO PACK
17.0000 g | PACK | Freq: Every day | ORAL | Status: DC
  Administered 2017-06-13: 17 g via ORAL
  Filled 2017-06-13 (×2): qty 1

## 2017-06-13 NOTE — Progress Notes (Signed)
Randy Repressohini R Vanga, MD [address]  [address]  [place], [place] [address]  Main: [phone]  Fax: [phone] Pager: [phone]   Randy Nelson is being followed for obstructive jaundice Day 3 of follow up    Subjective: Underwent ERCP yesterday, stones from CBD were removed Denies worsening of upper abdominal pain, nausea, vomiting, fever On clear liquid diet   Objective: Vital signs in last 24 hours: Vitals:   06/12/17 1530 06/12/17 2035 06/13/17 0436 06/13/17 0807  BP: (!) 146/64 (!) 156/66 126/60 (!) 138/55  Pulse: 64 98 65 66  Resp: 16 20 20 15   Temp: 98.1 F (36.7 C) 98.5 F (36.9 C) 98.2 F (36.8 C) 98.3 F (36.8 C)  TempSrc: Axillary Oral Oral Oral  SpO2: 97% 94% 97% 96%  Weight:      Height:       Weight change:   Intake/Output Summary (Last 24 hours) at 06/13/17 [id number] Last data filed at 06/13/17 0736  Gross per 24 hour  Intake             2405 ml  Output              725 ml  Net             1680 ml     Exam: Heart:: Regular rate and rhythm or S1S2 present Lungs: clear to auscultation Abdomen: soft, mild epigastric and right upper quadrant tenderness, normal bowel sounds   Lab Results: LFTs are pending from today Micro Results: Recent Results (from the past 240 hour(s))  Urine culture     Status: Abnormal   Collection Time: 06/10/17  3:53 PM  Result Value Ref Range Status   Specimen Description URINE, RANDOM  Final   Special Requests NONE  Final   Culture (A)  Final    <10,000 COLONIES/mL INSIGNIFICANT GROWTH Performed at [facility], [address] [place], [place] [address]    Report Status 06/12/2017 FINAL  Final  Blood Culture (routine x 2)     Status: Abnormal   Collection Time: 06/10/17  4:14 PM  Result Value Ref Range Status   Specimen Description BLOOD BLOOD RIGHT HAND  Final   Special Requests   Final    BOTTLES DRAWN AEROBIC AND ANAEROBIC Blood Culture results may not be optimal due to an excessive volume  of blood received in culture bottles   Culture  Setup Time   Final    GRAM NEGATIVE RODS IN BOTH AEROBIC AND ANAEROBIC BOTTLES CRITICAL RESULT CALLED TO, READ BACK BY AND VERIFIED WITH: KAREN HAYES AT 0743 ON 06/11/17 [facility].    Culture KLEBSIELLA PNEUMONIAE (A)  Final   Report Status 06/13/2017 FINAL  Final   Organism ID, Bacteria KLEBSIELLA PNEUMONIAE  Final      Susceptibility   Klebsiella pneumoniae - MIC*    AMPICILLIN >=32 RESISTANT Resistant     CEFAZOLIN <=4 SENSITIVE Sensitive     CEFEPIME <=1 SENSITIVE Sensitive     CEFTAZIDIME <=1 SENSITIVE Sensitive     CEFTRIAXONE <=1 SENSITIVE Sensitive     CIPROFLOXACIN <=0.25 SENSITIVE Sensitive     GENTAMICIN <=1 SENSITIVE Sensitive     IMIPENEM <=0.25 SENSITIVE Sensitive     TRIMETH/SULFA <=20 SENSITIVE Sensitive     AMPICILLIN/SULBACTAM 4 SENSITIVE Sensitive     PIP/TAZO <=4 SENSITIVE Sensitive     Extended ESBL NEGATIVE Sensitive     * KLEBSIELLA PNEUMONIAE  Blood Culture (routine x 2)     Status:  Abnormal   Collection Time: 06/10/17  4:14 PM  Result Value Ref Range Status   Specimen Description BLOOD BLOOD LEFT HAND  Final   Special Requests   Final    BOTTLES DRAWN AEROBIC AND ANAEROBIC Blood Culture results may not be optimal due to an excessive volume of blood received in culture bottles   Culture  Setup Time   Final    GRAM NEGATIVE RODS IN BOTH AEROBIC AND ANAEROBIC BOTTLES CRITICAL VALUE NOTED.  VALUE IS CONSISTENT WITH PREVIOUSLY REPORTED AND CALLED VALUE.    Culture (A)  Final    KLEBSIELLA PNEUMONIAE SUSCEPTIBILITIES PERFORMED ON PREVIOUS CULTURE WITHIN THE LAST 5 DAYS. Performed at [facility], [address], [place], [place] [address]    Report Status 06/13/2017 FINAL  Final  Blood Culture ID Panel (Reflexed)     Status: Abnormal   Collection Time: 06/10/17  4:14 PM  Result Value Ref Range Status   Enterococcus species NOT DETECTED NOT DETECTED Final   Listeria monocytogenes NOT DETECTED NOT DETECTED  Final   Staphylococcus species NOT DETECTED NOT DETECTED Final   Staphylococcus aureus NOT DETECTED NOT DETECTED Final   Streptococcus species NOT DETECTED NOT DETECTED Final   Streptococcus agalactiae NOT DETECTED NOT DETECTED Final   Streptococcus pneumoniae NOT DETECTED NOT DETECTED Final   Streptococcus pyogenes NOT DETECTED NOT DETECTED Final   Acinetobacter baumannii NOT DETECTED NOT DETECTED Final   Enterobacteriaceae species DETECTED (A) NOT DETECTED Final    Comment: Enterobacteriaceae represent a large family of gram-negative bacteria, not a single organism. CRITICAL RESULT CALLED TO, READ BACK BY AND VERIFIED WITH: KAREN HAYES AT 0743 ON 06/11/17 [facility].    Enterobacter cloacae complex NOT DETECTED NOT DETECTED Final   Escherichia coli NOT DETECTED NOT DETECTED Final   Klebsiella oxytoca NOT DETECTED NOT DETECTED Final   Klebsiella pneumoniae DETECTED (A) NOT DETECTED Final    Comment: CRITICAL RESULT CALLED TO, READ BACK BY AND VERIFIED WITH: KAREN HAYES AT 0743 ON 06/11/17 [facility].    Proteus species NOT DETECTED NOT DETECTED Final   Serratia marcescens NOT DETECTED NOT DETECTED Final   Carbapenem resistance NOT DETECTED NOT DETECTED Final   Haemophilus influenzae NOT DETECTED NOT DETECTED Final   Neisseria meningitidis NOT DETECTED NOT DETECTED Final   Pseudomonas aeruginosa NOT DETECTED NOT DETECTED Final   Candida albicans NOT DETECTED NOT DETECTED Final   Candida glabrata NOT DETECTED NOT DETECTED Final   Candida krusei NOT DETECTED NOT DETECTED Final   Candida parapsilosis NOT DETECTED NOT DETECTED Final   Candida tropicalis NOT DETECTED NOT DETECTED Final   Studies/Results: Dg C-arm 1-60 Min-no Report  Result Date: 06/12/2017 Fluoroscopy was utilized by the requesting physician.  No radiographic interpretation.   Medications: I have reviewed the patient's current medications. Scheduled Meds: . cyclobenzaprine  5 mg Oral QHS  . enoxaparin (LOVENOX) injection  40 mg  Subcutaneous Q24H  . insulin aspart  0-20 Units Subcutaneous Q4H  . insulin detemir  25 Units Subcutaneous BID  . lisinopril  40 mg Oral Daily  . polyethylene glycol  17 g Oral Daily  . sodium chloride flush  3 mL Intravenous Q12H   Continuous Infusions: . 0.9 % NaCl with KCl 20 mEq / L 100 mL/hr at 06/13/17 0255  . piperacillin-tazobactam (ZOSYN)  IV Stopped (06/13/17 0509)  . sodium chloride     PRN Meds:.acetaminophen **OR** acetaminophen, albuterol, ondansetron **OR** ondansetron (ZOFRAN) IV, oxyCODONE, polyethylene glycol   Assessment: Active Problems:   Cholecystitis  Hyperbilirubinemia   Calculus of common duct without obstruction   Elevated bilirubin  Jeptha Hinnenkamp is a 61 y.o. y/o male with Diabetes on insulin, hypertension presents with acute calculus cholecystitis and hyperbilirubinemia, status post-ERCP on 06/12/2017 showed CBD stones, underwent sphincterotomy and removal of bile duct stones. He also has Klebsiella pneumonia bacteremia. He does not have post-ERCP pancreatitis  Plan: - Clear liquid diet - Follow LFTs today - Deferred to general surgery for timing of cholecystectomy - ID consulted for bacteremia  I will sign off for now of. His call us back with questions/concerns   LOS: 3 days   Rohini Nelson 06/13/2017, 9:42 AM

## 2017-06-13 NOTE — Progress Notes (Signed)
Pharmacy Antibiotic Note  Randy Nelson is a 61 y.o. male admitted on 06/10/2017 with sepsis.  Pharmacy has been consulted for unasyn dosing. Patient has been changed from Zosyn to Unasyn by ID MD Randy Nelson.   Bcx:  KLEBSIELLA PNEUMONIAE Per Surgery note 9/7: +Cholangitis   Plan:  Day 4- Zosyn 3.375g IV q8h (4 hour infusion).     Height: 6\' 3"  (190.5 cm) Weight: 243 lb 8 oz (110.5 kg) IBW/kg (Calculated) : 84.5  Temp (24hrs), Avg:98.4 F (36.9 C), Min:98.2 F (36.8 C), Max:98.7 F (37.1 C)   Recent Labs Lab 06/07/17 0535 06/10/17 1614 06/11/17 0506 06/12/17 0318 06/13/17 0815  WBC 14.9* 12.2* 14.8* 12.1* 13.3*  CREATININE 0.90 1.13 1.54* 1.44* 1.42*  LATICACIDVEN  --  1.8  --   --   --     Estimated Creatinine Clearance: 71.5 mL/min (A) (by C-G formula based on SCr of 1.42 mg/dL (H)).    Allergies  Allergen Reactions  . Morphine And Related Other (See Comments)    Chest tightness  . Sulfa Antibiotics Other (See Comments)    Family history of allergy, mostly rashes. No anaphylaxis reported.    Antimicrobials this admission: 9/4 zosyn >> 9/7 9/7 unasyn    Microbiology results: 9/4 BCx: 2 of 4 bottles= KLEBSIELLA PNEUMONIAE 9/4 UCx: insignif growth  Thank you for allowing pharmacy to be a part of this patient's care.  Randy Nelson 06/13/2017 4:51 PM

## 2017-06-13 NOTE — Progress Notes (Signed)
06/13/2017  Subjective: Patient had ERCP yesterday which retrieved two stones from CBD and did sphincterotomy.  No acute events overnight.  No worsening pain.  Tolerated clears.  Vital signs: Temp:  [96.1 F (35.6 C)-98.7 F (37.1 C)] 98.7 F (37.1 C) (09/07 1227) Pulse Rate:  [64-98] 64 (09/07 1227) Resp:  [9-20] 16 (09/07 1227) BP: (119-156)/(49-67) 127/62 (09/07 1227) SpO2:  [94 %-98 %] 95 % (09/07 1227)   Intake/Output: 09/06 0701 - 09/07 0700 In: 2405 [I.V.:2255; IV Piggyback:150] Out: 650 [Urine:650] Last BM Date: 06/11/17  Physical Exam: Constitutional: No acute distress Abdomen:  Soft, mildly distended, minimal tenderness to palpation.  Labs:   Recent Labs  06/12/17 0318 06/13/17 0815  WBC 12.1* 13.3*  HGB 10.2* 9.8*  HCT 29.4* 28.7*  PLT 238 203    Recent Labs  06/12/17 0318 06/13/17 0815  NA 140 140  K 3.3* 3.7  CL 107 108  CO2 25 25  GLUCOSE 157* 146*  BUN 34* 37*  CREATININE 1.44* 1.42*  CALCIUM 7.6* 7.6*    Recent Labs  06/11/17 0506  LABPROT 16.8*  INR 1.37    Imaging: Dg C-arm 1-60 Min-no Report  Result Date: 06/12/2017 Fluoroscopy was utilized by the requesting physician.  No radiographic interpretation.    Assessment/Plan: 61 yo male with cholangitis and bacteremia, s/p ERCP  --His laboratory workup today reveals an elevated total bilirubin still, but his ERCP was successful, so expect this to improve still.  His lipase is elevated and given his mild discomfort, he may be having a low grade post-ERCP pancreatitis episode. --discussed with patient that given the severity of his disease process on admission, would want him to get better and recover from the cholangitis episode and be treated with antibiotics for cholangitis and bacteremia.  Then would proceed with interval cholecystectomy.  Discussed with him that I would be unavailable most of next month, but he would rather not wait until November for surgery.  Will instead have him  follow up with one of my partners on discharge and be set up for surgery with him. --from the surgical standpoint, would continue a clear liquid diet given his elevated lipase today.  Continue IV antibiotics until WBC normalizes and then transition to oral based on sensitivities.   Howie IllJose Luis Audray Rumore, MD [facility]

## 2017-06-13 NOTE — Progress Notes (Signed)
[facility] [place] at [facility]   PATIENT NAME: Randy ReiningLeslie Cloninger    MR#:  [id number]  DATE OF BIRTH:  May 09, 1956  SUBJECTIVE;status post ERCP, 2 stones in CBD are removed.patient has mild post ERCP pancreatitis today. His abdominal pain is improved. No fever. No other complaints.  CHIEF COMPLAINT:   Chief Complaint  Patient presents with  . Fever  . Nausea    REVIEW OF SYSTEMS:   ROS CONSTITUTIONAL:nofurther fevers EYES: No blurred or double vision.  EARS, NOSE, AND THROAT: No tinnitus or ear pain.  RESPIRATORY: No cough, shortness of breath, wheezing or hemoptysis.  CARDIOVASCULAR: No chest pain, orthopnea, edema.  GASTROINTESTINAL: Still has right upper quadrant abdominal pain, nausea, jaundice. Not Tolerating clear liquids also. GENITOURINARY: No dysuria, hematuria.  ENDOCRINE: No polyuria, nocturia,  HEMATOLOGY: No anemia, easy bruising or bleeding SKIN: No rash or lesion. MUSCULOSKELETAL: No joint pain or arthritis.   NEUROLOGIC: No tingling, numbness, weakness.  PSYCHIATRY: No anxiety or depression.   DRUG ALLERGIES:   Allergies  Allergen Reactions  . Morphine And Related Other (See Comments)    Chest tightness  . Sulfa Antibiotics Other (See Comments)    Family history of allergy, mostly rashes. No anaphylaxis reported.    VITALS:  Blood pressure 127/62, pulse 64, temperature 98.7 F (37.1 C), temperature source Oral, resp. rate 16, height 6\' 3"  (1.905 m), weight 110.5 kg (243 lb 8 oz), SpO2 95 %.  PHYSICAL EXAMINATION:  GENERAL:  61 y.o.-year-old patient lying in the bed with no acute distress.  EYES: Pupils equal, round, reactive to light  . Patient has scleral icterus and appears jaundiced. HEENT: Head atraumatic, normocephalic. Oropharynx and nasopharynx clear.  NECK:  Supple, no jugular venous distention. No thyroid enlargement, no tenderness.  LUNGS: Normal breath sounds bilaterally, no wheezing, rales,rhonchi or crepitation.  No use of accessory muscles of respiration.  CARDIOVASCULAR: S1, S2 normal. No murmurs, rubs, or gallops.  ABDOMEN: no right upper quadrant tenderness today. Bowel sounds present, no organomegaly.  EXTREMITIES: No pedal edema, cyanosis, or clubbing.  NEUROLOGIC: Cranial nerves II through XII are intact. Muscle strength 5/5 in all extremities. Sensation intact. Gait not checked.  PSYCHIATRIC: The patient is alert and oriented x 3.  SKIN: No obvious rash, lesion, or ulcer.    LABORATORY PANEL:   CBC  Recent Labs Lab 06/13/17 0815  WBC 13.3*  HGB 9.8*  HCT 28.7*  PLT 203   ------------------------------------------------------------------------------------------------------------------  Chemistries   Recent Labs Lab 06/13/17 0815  NA 140  K 3.7  CL 108  CO2 25  GLUCOSE 146*  BUN 37*  CREATININE 1.42*  CALCIUM 7.6*  AST 58*  ALT 92*  ALKPHOS 258*  BILITOT 5.5*   ------------------------------------------------------------------------------------------------------------------  Cardiac Enzymes  Recent Labs Lab 06/11/17 0506  TROPONINI 0.11*   ------------------------------------------------------------------------------------------------------------------  RADIOLOGY:  Dg C-arm 1-60 Min-no Report  Result Date: 06/12/2017 Fluoroscopy was utilized by the requesting physician.  No radiographic interpretation.    EKG:   Orders placed or performed during the hospital encounter of 06/10/17  . ED EKG 12-Lead  . ED EKG  . ED EKG 12-Lead  . ED EKG    ASSESSMENT AND PLAN:   #1.acute cholecystitis with cholangitis: Patient had chills and fever, right upper quadrant pain and nausea and vomiting for last 2 days. Status post ERCP yesterday 2 stones from CBD are removed. Seen by surgery, patient needs cholecystectomy once sepsis improves patient can follow up with cholecystectomy.  #2 kidney injury with  acute renal failure and sepsis continue IV hydration, kidney  function stable.  #3/. slightly elevated troponins likely due to demand ischemia from sepsis. Seen by cardiology.  Echocardiogram showed EF 55%. With normal systolic function.  #4 .obstructive jaundice with elevated alkaline phosphatase, AST, ALT, bilirubin. S/pERCP.hopefully liver functions will improve. By tomorrow.  #5. diabetes mellitus type 2 with hyperglycemia: Better sugar control today, continue Levemir 25 units twice a day, sliding scale insulin with high-dose coverage.  spoke with gastroenterology.    #6/sepsis with klebsiella bacteremia: Secondary to acute cholangitis. Continue IV Zosyn for now, repeat blood cultures ordered.  #7. postERCP pancreatitis with slightly elevated lipase.[id number]. Monitor lipase tomorrow, continue clear liquids.she still has jaundice and elevated LFTs. But they are better. AST down to 58 ALT down to 92. Total bili still 5.5. All the records are reviewed and case discussed with Care Management/Social Workerr. Management plans discussed with the patient, family and they are in agreement.  CODE STATUS:full  TOTAL TIME TAKING CARE OF THIS PATIENT:35 minutes.   POSSIBLE D/C IN 1-2 DAYS, DEPENDING ON CLINICAL CONDITION. D/w RN  Katha Hamming M.D on 06/13/2017 at 2:32 PM  Between 7am to 6pm - Pager - [phone]  After 6pm go to [email or web address] - password [email or web address] [facility]  [facility]  Office  [phone]  CC: Primary care physician; Eulogio Bear., MD   Note: This dictation was prepared with Dragon dictation along with smaller phrase technology. Any transcriptional errors that result from this process are unintentional.

## 2017-06-13 NOTE — Progress Notes (Signed)
Inpatient Diabetes Program Recommendations  AACE/ADA: New Consensus Statement on Inpatient Glycemic Control (2015)  Target Ranges:  Prepandial:   less than 140 mg/dL      Peak postprandial:   less than 180 mg/dL (1-2 hours)      Critically ill patients:  140 - 180 mg/dL   Lab Results  Component Value Date   GLUCAP 171 (H) 06/13/2017    Review of Glycemic ControlResults for Randy Nelson, Randy Nelson (MRN [id number]) as of 06/13/2017 15:51  Ref. Range 06/12/2017 23:58 06/13/2017 00:34 06/13/2017 04:34 06/13/2017 07:50 06/13/2017 11:45  Glucose-Capillary Latest Ref Range: 65 - 99 mg/dL 78 86 409152 (H) 811129 (H) 914171 (H)   Diabetes history: Type 2 diabetes-A1C on 05/06/17=7.1% per Care Everywhere Outpatient Diabetes medications: Levemir 50 units bid, Humalog 5-30 units tid with meals, Metformin 1000 mg bid Current orders for Inpatient glycemic control: Levemir 25 units bid, Novolog moderate q 4 hours  Inpatient Diabetes Program Recommendations:    Spoke briefly with patient regarding glycemic control.  Blood sugars have improved.  Patient is well educated on his diabetes and has recently worn a CGM to obtain better control of his blood sugars.    As he begins eating more, his insulin needs will likely increase also.   Will follow.  Thanks, Beryl MeagerJenny Renald Haithcock, RN, BC-ADM Inpatient Diabetes Coordinator Pager [phone] (8a-5p)

## 2017-06-13 NOTE — Progress Notes (Signed)
Pharmacy Antibiotic Note  Randy Nelson is a 61 y.o. male admitted on 06/10/2017 with sepsis.  Pharmacy has been consulted for zosyn dosing.  Bcx:  KLEBSIELLA PNEUMONIAE Per Surgery note 9/7: +Cholangitis   Plan:  Day 4- Zosyn 3.375g IV q8h (4 hour infusion).     Height: 6\' 3"  (190.5 cm) Weight: 243 lb 8 oz (110.5 kg) IBW/kg (Calculated) : 84.5  Temp (24hrs), Avg:98 F (36.7 C), Min:96.1 F (35.6 C), Max:98.7 F (37.1 C)   Recent Labs Lab 06/07/17 0535 06/10/17 1614 06/11/17 0506 06/12/17 0318 06/13/17 0815  WBC 14.9* 12.2* 14.8* 12.1* 13.3*  CREATININE 0.90 1.13 1.54* 1.44* 1.42*  LATICACIDVEN  --  1.8  --   --   --     Estimated Creatinine Clearance: 71.5 mL/min (A) (by C-G formula based on SCr of 1.42 mg/dL (H)).    Allergies  Allergen Reactions  . Morphine And Related Other (See Comments)    Chest tightness  . Sulfa Antibiotics Other (See Comments)    Family history of allergy, mostly rashes. No anaphylaxis reported.    Antimicrobials this admission: 9/4 zosyn >>    Microbiology results: 9/4 BCx: 2 of 4 bottles= KLEBSIELLA PNEUMONIAE 9/4 UCx: insignif growth  Thank you for allowing pharmacy to be a part of this patient's care.  Randy Nelson A 06/13/2017 1:16 PM

## 2017-06-13 NOTE — Progress Notes (Signed)
Spoke with Dr.Willis per phone about patient requesting to only take 15 units of levemir instead of 25 units due to low blood sugar the night before. New order to hold levemir for tonight. Anselm Junglingonyers,Kateland Leisinger M

## 2017-06-14 ENCOUNTER — Inpatient Hospital Stay: Payer: BLUE CROSS/BLUE SHIELD

## 2017-06-14 ENCOUNTER — Encounter: Payer: Self-pay | Admitting: Radiology

## 2017-06-14 DIAGNOSIS — K8031 Calculus of bile duct with cholangitis, unspecified, with obstruction: Secondary | ICD-10-CM

## 2017-06-14 DIAGNOSIS — R7881 Bacteremia: Secondary | ICD-10-CM

## 2017-06-14 LAB — CBC WITH DIFFERENTIAL/PLATELET
BASOS PCT: 0 %
Basophils Absolute: 0 10*3/uL (ref 0–0.1)
Eosinophils Absolute: 0.1 10*3/uL (ref 0–0.7)
Eosinophils Relative: 1 %
HEMATOCRIT: 27.1 % — AB (ref 40.0–52.0)
HEMOGLOBIN: 9.5 g/dL — AB (ref 13.0–18.0)
LYMPHS ABS: 1.1 10*3/uL (ref 1.0–3.6)
Lymphocytes Relative: 10 %
MCH: 31.6 pg (ref 26.0–34.0)
MCHC: 35 g/dL (ref 32.0–36.0)
MCV: 90.1 fL (ref 80.0–100.0)
MONOS PCT: 7 %
Monocytes Absolute: 0.9 10*3/uL (ref 0.2–1.0)
NEUTROS ABS: 9.7 10*3/uL — AB (ref 1.4–6.5)
NEUTROS PCT: 82 %
Platelets: 239 10*3/uL (ref 150–440)
RBC: 3.01 MIL/uL — ABNORMAL LOW (ref 4.40–5.90)
RDW: 14.2 % (ref 11.5–14.5)
WBC: 11.8 10*3/uL — ABNORMAL HIGH (ref 3.8–10.6)

## 2017-06-14 LAB — COMPREHENSIVE METABOLIC PANEL
ALBUMIN: 2.1 g/dL — AB (ref 3.5–5.0)
ALBUMIN: 2 g/dL — AB (ref 3.5–5.0)
ALK PHOS: 444 U/L — AB (ref 38–126)
ALT: 82 U/L — AB (ref 17–63)
ALT: 83 U/L — ABNORMAL HIGH (ref 17–63)
ALT: 81 U/L — AB (ref 17–63)
ANION GAP: 8 (ref 5–15)
AST: 62 U/L — AB (ref 15–41)
AST: 73 U/L — ABNORMAL HIGH (ref 15–41)
AST: 73 U/L — AB (ref 15–41)
Albumin: 2 g/dL — ABNORMAL LOW (ref 3.5–5.0)
Alkaline Phosphatase: 377 U/L — ABNORMAL HIGH (ref 38–126)
Alkaline Phosphatase: 469 U/L — ABNORMAL HIGH (ref 38–126)
Anion gap: 8 (ref 5–15)
Anion gap: 8 (ref 5–15)
BUN: 27 mg/dL — AB (ref 6–20)
BUN: 23 mg/dL — ABNORMAL HIGH (ref 6–20)
BUN: 23 mg/dL — AB (ref 6–20)
CALCIUM: 7.5 mg/dL — AB (ref 8.9–10.3)
CHLORIDE: 109 mmol/L (ref 101–111)
CHLORIDE: 105 mmol/L (ref 101–111)
CHLORIDE: 105 mmol/L (ref 101–111)
CO2: 24 mmol/L (ref 22–32)
CO2: 23 mmol/L (ref 22–32)
CO2: 23 mmol/L (ref 22–32)
CREATININE: 0.7 mg/dL (ref 0.61–1.24)
Calcium: 7.5 mg/dL — ABNORMAL LOW (ref 8.9–10.3)
Calcium: 7.6 mg/dL — ABNORMAL LOW (ref 8.9–10.3)
Creatinine, Ser: 0.82 mg/dL (ref 0.61–1.24)
Creatinine, Ser: 0.78 mg/dL (ref 0.61–1.24)
GFR calc Af Amer: >60 mL/min (ref >60)
GFR calc Af Amer: >60 mL/min (ref >60)
GFR calc Af Amer: >60 mL/min (ref >60)
GFR calc non Af Amer: >60 mL/min (ref >60)
GFR calc non Af Amer: >60 mL/min (ref >60)
GLUCOSE: 119 mg/dL — AB (ref 65–99)
GLUCOSE: 165 mg/dL — AB (ref 65–99)
Glucose, Bld: 87 mg/dL (ref 65–99)
POTASSIUM: 3.6 mmol/L (ref 3.5–5.1)
Potassium: 3.3 mmol/L — ABNORMAL LOW (ref 3.5–5.1)
Potassium: 3.3 mmol/L — ABNORMAL LOW (ref 3.5–5.1)
SODIUM: 136 mmol/L (ref 135–145)
SODIUM: 136 mmol/L (ref 135–145)
Sodium: 141 mmol/L (ref 135–145)
Total Bilirubin: 5.5 mg/dL — ABNORMAL HIGH (ref 0.3–1.2)
Total Bilirubin: 5.8 mg/dL — ABNORMAL HIGH (ref 0.3–1.2)
Total Bilirubin: 5.8 mg/dL — ABNORMAL HIGH (ref 0.3–1.2)
Total Protein: 5.9 g/dL — ABNORMAL LOW (ref 6.5–8.1)
Total Protein: 6.2 g/dL — ABNORMAL LOW (ref 6.5–8.1)
Total Protein: 6 g/dL — ABNORMAL LOW (ref 6.5–8.1)

## 2017-06-14 LAB — GLUCOSE, CAPILLARY
GLUCOSE-CAPILLARY: 87 mg/dL (ref 65–99)
GLUCOSE-CAPILLARY: 108 mg/dL — AB (ref 65–99)
GLUCOSE-CAPILLARY: 174 mg/dL — AB (ref 65–99)
Glucose-Capillary: 157 mg/dL — ABNORMAL HIGH (ref 65–99)
Glucose-Capillary: 76 mg/dL (ref 65–99)
Glucose-Capillary: 99 mg/dL (ref 65–99)

## 2017-06-14 LAB — MAGNESIUM: Magnesium: 2.2 mg/dL (ref 1.7–2.4)

## 2017-06-14 LAB — LIPASE, BLOOD: LIPASE: 776 U/L — AB (ref 11–51)

## 2017-06-14 LAB — BILIRUBIN, DIRECT: BILIRUBIN DIRECT: 3.9 mg/dL — AB (ref 0.1–0.5)

## 2017-06-14 MED ORDER — ONDANSETRON HCL 4 MG PO TABS: 4.0000 mg | ORAL_TABLET | Freq: Four times a day (QID) | ORAL | 0 refills | Status: DC | PRN

## 2017-06-14 MED ORDER — POTASSIUM CHLORIDE 10 MEQ/100ML IV SOLN
10.0000 meq | INTRAVENOUS | Status: DC
  Filled 2017-06-14: qty 100

## 2017-06-14 MED ORDER — IOPAMIDOL (ISOVUE-300) INJECTION 61%
125.0000 mL | Freq: Once | INTRAVENOUS | Status: AC | PRN
  Administered 2017-06-14: 125 mL via INTRAVENOUS

## 2017-06-14 MED ORDER — ACETAMINOPHEN 325 MG PO TABS: 650.0000 mg | ORAL_TABLET | Freq: Four times a day (QID) | ORAL | Status: AC | PRN

## 2017-06-14 MED ORDER — SODIUM CHLORIDE 0.9 % IV SOLN: 3.0000 g | Freq: Four times a day (QID) | INTRAVENOUS | Status: DC

## 2017-06-14 MED ORDER — LACTATED RINGERS IV SOLN: 150.0000 mL | INTRAVENOUS | 0 refills | Status: DC

## 2017-06-14 MED ORDER — CYCLOBENZAPRINE HCL 5 MG PO TABS: 5.0000 mg | ORAL_TABLET | Freq: Three times a day (TID) | ORAL | 0 refills | Status: DC | PRN

## 2017-06-14 MED ORDER — CYCLOBENZAPRINE HCL 5 MG PO TABS
5.0000 mg | ORAL_TABLET | Freq: Every day | ORAL | 0 refills | Status: DC
Start: 2017-06-14 — End: 2018-02-11

## 2017-06-14 MED ORDER — POTASSIUM CHLORIDE CRYS ER 20 MEQ PO TBCR
40.0000 meq | EXTENDED_RELEASE_TABLET | Freq: Two times a day (BID) | ORAL | Status: DC
  Administered 2017-06-14 – 2017-06-15 (×3): 40 meq via ORAL
  Filled 2017-06-14 (×3): qty 2

## 2017-06-14 MED ORDER — ONDANSETRON HCL 4 MG/2ML IJ SOLN: 4.0000 mg | Freq: Four times a day (QID) | INTRAMUSCULAR | 0 refills | Status: AC | PRN

## 2017-06-14 MED ORDER — ENOXAPARIN SODIUM 40 MG/0.4ML ~~LOC~~ SOLN: 40.0000 mg | SUBCUTANEOUS | Status: DC

## 2017-06-14 MED ORDER — LACTATED RINGERS IV SOLN
INTRAVENOUS | Status: DC
  Administered 2017-06-14 – 2017-06-15 (×5): via INTRAVENOUS

## 2017-06-14 MED ORDER — ALBUTEROL SULFATE (2.5 MG/3ML) 0.083% IN NEBU: 2.5000 mg | INHALATION_SOLUTION | RESPIRATORY_TRACT | 12 refills | Status: AC | PRN

## 2017-06-14 MED ORDER — OXYCODONE HCL 5 MG PO TABS: 5.0000 mg | ORAL_TABLET | ORAL | 0 refills | Status: DC | PRN

## 2017-06-14 NOTE — Progress Notes (Signed)
Randy Darby, MD [address]  [place]  [place], [place] [address]  Main: [phone]  Fax: [phone] Pager: [phone]   Randy Nelson is being followed for obstructive jaundice Day 4 of follow up    Subjective: Ongoing epigastric pain, nausea Feels more distended, having BMs On clear liquid diet Afebrile Is frustrated   Objective: Vital signs in last 24 hours: Vitals:   06/13/17 1227 06/13/17 2024 06/14/17 0445 06/14/17 0500  BP: 127/62 (!) 143/50 (!) 161/60   Pulse: 64 73 69   Resp: _0 Temp: 98.7 F (37.1 C) 98.9 F (37.2 C) 98.7 F (37.1 C)   TempSrc: Oral Oral Oral   SpO2: 95% 94% 95%   Weight:    250 lb 8 oz (113.6 kg)  Height:       Weight change:   Intake/Output Summary (Last 24 hours) at 06/14/17 1130 Last data filed at 06/14/17 7371  Gross per 24 hour  Intake             3825 ml  Output             1075 ml  Net             2750 ml     Exam: Heart:: Regular rate and rhythm or S1S2 present Lungs: clear to auscultation Abdomen: soft, moderate epigastric tenderness, normal bowel sounds, distended, tympanic   Lab Results: Hepatic Function Latest Ref Rng & Units 06/14/2017 06/13/2017 06/12/2017  Total Protein 6.5 - 8.1 g/dL 5.9(L) 6.2(L) 6.0(L)  Albumin 3.5 - 5.0 g/dL 2.0(L) 2.2(L) 2.4(L)  AST 15 - 41 U/L 62(H) 58(H) 94(H)  ALT 17 - 63 U/L 82(H) 92(H) 128(H)  Alk Phosphatase 38 - 126 U/L 377(H) 258(H) 272(H)  Total Bilirubin 0.3 - 1.2 mg/dL 5.5(H) 5.5(H) 5.3(H)   Micro Results: Recent Results (from the past 240 hour(s))  Urine culture     Status: Abnormal   Collection Time: 06/10/17  3:53 PM  Result Value Ref Range Status   Specimen Description URINE, RANDOM  Final   Special Requests NONE  Final   Culture (A)  Final    <10,000 COLONIES/mL INSIGNIFICANT GROWTH Performed at [facility], [address] [place], [place] [address]    Report Status 06/12/2017 FINAL  Final  Blood Culture (routine x 2)     Status:  Abnormal   Collection Time: 06/10/17  4:14 PM  Result Value Ref Range Status   Specimen Description BLOOD BLOOD RIGHT HAND  Final   Special Requests   Final    BOTTLES DRAWN AEROBIC AND ANAEROBIC Blood Culture results may not be optimal due to an excessive volume of blood received in culture bottles   Culture  Setup Time   Final    GRAM NEGATIVE RODS IN BOTH AEROBIC AND ANAEROBIC BOTTLES CRITICAL RESULT CALLED TO, READ BACK BY AND VERIFIED WITH: KAREN HAYES AT 4854 ON 06/11/17 [place].    Culture KLEBSIELLA PNEUMONIAE (A)  Final   Report Status 06/13/2017 FINAL  Final   Organism ID, Bacteria KLEBSIELLA PNEUMONIAE  Final      Susceptibility   Klebsiella pneumoniae - MIC*    AMPICILLIN >=32 RESISTANT Resistant     CEFAZOLIN <=4 SENSITIVE Sensitive     CEFEPIME <=1 SENSITIVE Sensitive     CEFTAZIDIME <=1 SENSITIVE Sensitive     CEFTRIAXONE <=1 SENSITIVE Sensitive     CIPROFLOXACIN <=0.25 SENSITIVE Sensitive     GENTAMICIN <=1 SENSITIVE Sensitive  IMIPENEM <=0.25 SENSITIVE Sensitive     TRIMETH/SULFA <=20 SENSITIVE Sensitive     AMPICILLIN/SULBACTAM 4 SENSITIVE Sensitive     PIP/TAZO <=4 SENSITIVE Sensitive     Extended ESBL NEGATIVE Sensitive     * KLEBSIELLA PNEUMONIAE  Blood Culture (routine x 2)     Status: Abnormal   Collection Time: 06/10/17  4:14 PM  Result Value Ref Range Status   Specimen Description BLOOD BLOOD LEFT HAND  Final   Special Requests   Final    BOTTLES DRAWN AEROBIC AND ANAEROBIC Blood Culture results may not be optimal due to an excessive volume of blood received in culture bottles   Culture  Setup Time   Final    GRAM NEGATIVE RODS IN BOTH AEROBIC AND ANAEROBIC BOTTLES CRITICAL VALUE NOTED.  VALUE IS CONSISTENT WITH PREVIOUSLY REPORTED AND CALLED VALUE.    Culture (A)  Final    KLEBSIELLA PNEUMONIAE SUSCEPTIBILITIES PERFORMED ON PREVIOUS CULTURE WITHIN THE LAST 5 DAYS. Performed at [facility], [place] [address], [place], [place] [address]    Report  Status 06/13/2017 FINAL  Final  Blood Culture ID Panel (Reflexed)     Status: Abnormal   Collection Time: 06/10/17  4:14 PM  Result Value Ref Range Status   Enterococcus species NOT DETECTED NOT DETECTED Final   Listeria monocytogenes NOT DETECTED NOT DETECTED Final   Staphylococcus species NOT DETECTED NOT DETECTED Final   Staphylococcus aureus NOT DETECTED NOT DETECTED Final   Streptococcus species NOT DETECTED NOT DETECTED Final   Streptococcus agalactiae NOT DETECTED NOT DETECTED Final   Streptococcus pneumoniae NOT DETECTED NOT DETECTED Final   Streptococcus pyogenes NOT DETECTED NOT DETECTED Final   Acinetobacter baumannii NOT DETECTED NOT DETECTED Final   Enterobacteriaceae species DETECTED (A) NOT DETECTED Final    Comment: Enterobacteriaceae represent a large family of gram-negative bacteria, not a single organism. CRITICAL RESULT CALLED TO, READ BACK BY AND VERIFIED WITH: KAREN HAYES AT 7893 ON 06/11/17 [place].    Enterobacter cloacae complex NOT DETECTED NOT DETECTED Final   Escherichia coli NOT DETECTED NOT DETECTED Final   Klebsiella oxytoca NOT DETECTED NOT DETECTED Final   Klebsiella pneumoniae DETECTED (A) NOT DETECTED Final    Comment: CRITICAL RESULT CALLED TO, READ BACK BY AND VERIFIED WITH: KAREN HAYES AT 0743 ON 06/11/17 [place].    Proteus species NOT DETECTED NOT DETECTED Final   Serratia marcescens NOT DETECTED NOT DETECTED Final   Carbapenem resistance NOT DETECTED NOT DETECTED Final   Haemophilus influenzae NOT DETECTED NOT DETECTED Final   Neisseria meningitidis NOT DETECTED NOT DETECTED Final   Pseudomonas aeruginosa NOT DETECTED NOT DETECTED Final   Candida albicans NOT DETECTED NOT DETECTED Final   Candida glabrata NOT DETECTED NOT DETECTED Final   Candida krusei NOT DETECTED NOT DETECTED Final   Candida parapsilosis NOT DETECTED NOT DETECTED Final   Candida tropicalis NOT DETECTED NOT DETECTED Final  CULTURE, BLOOD (ROUTINE X 2) w Reflex to ID Panel     Status:  None (Preliminary result)   Collection Time: 06/13/17  1:40 PM  Result Value Ref Range Status   Specimen Description BLOOD RIGHT ASSIST CONTROL  Final   Special Requests   Final    BOTTLES DRAWN AEROBIC AND ANAEROBIC Blood Culture results may not be optimal due to an inadequate volume of blood received in culture bottles   Culture NO GROWTH < 24 HOURS  Final   Report Status PENDING  Incomplete  CULTURE, BLOOD (ROUTINE X 2) w  Reflex to ID Panel     Status: None (Preliminary result)   Collection Time: 06/13/17  1:49 PM  Result Value Ref Range Status   Specimen Description BLOOD RIGHT WRIST  Final   Special Requests   Final    BOTTLES DRAWN AEROBIC AND ANAEROBIC Blood Culture results may not be optimal due to an excessive volume of blood received in culture bottles   Culture NO GROWTH < 24 HOURS  Final   Report Status PENDING  Incomplete   Studies/Results: Dg C-arm 1-60 Min-no Report  Result Date: 06/12/2017 Fluoroscopy was utilized by the requesting physician.  No radiographic interpretation.   Medications: I have reviewed the patient's current medications. Scheduled Meds: . cyclobenzaprine  5 mg Oral QHS  . enoxaparin (LOVENOX) injection  40 mg Subcutaneous Q24H  . insulin aspart  0-20 Units Subcutaneous Q4H  . insulin detemir  25 Units Subcutaneous BID  . lisinopril  40 mg Oral Daily  . polyethylene glycol  17 g Oral Daily  . sodium chloride flush  3 mL Intravenous Q12H   Continuous Infusions: . 0.9 % NaCl with KCl 20 mEq / L 100 mL/hr at 06/14/17 0646  . ampicillin-sulbactam (UNASYN) IV Stopped (06/14/17 0716)  . lactated ringers 200 mL/hr at 06/14/17 1031  . potassium chloride    . sodium chloride     PRN Meds:.acetaminophen **OR** acetaminophen, albuterol, cyclobenzaprine, ondansetron **OR** ondansetron (ZOFRAN) IV, oxyCODONE, polyethylene glycol   Assessment: Active Problems:   Cholecystitis   Hyperbilirubinemia   Calculus of common duct without obstruction    Elevated bilirubin   Cholangitis due to bile duct calculus with obstruction   Bacteremia  Bernhardt Riemenschneider is a 61 y.o. y/o male with Diabetes on insulin, hypertension presents with acute calculus cholecystitis and choledocholithiasis, gall stone pancreatitis, status post-ERCP on 06/12/2017 showed CBD stones, underwent sphincterotomy and removal of two bile duct stones. He also has Klebsiella pneumonia bacteremia. He has persistently elevated Alk Phos and T Bili post ERCP. Could be due to ongoing biliary obstruction from residual CBD stones or ampullary swelling or he may have passed a new stone. He also developed mild ileus from pancreatitis and cholecystitis   Plan: - Clear liquid diet - Antiemetics and pain control - LR 219m/hr x 1lit then 1566mhr - On Unasyn for bacteremia per ID - Follow daily LFTs - CT A/P  - Prefer removal of loop recorder to undergo MRCP which is sensitive than CT for detection of CBD stones - General surgery to decide timing of cholecystectomy - Check serum Potassium and Magnesium, goal K > 4 and Mag > 2 to prevent ileus     LOS: 4 days   Cindi Ghazarian 06/14/2017, 11:30 AM

## 2017-06-14 NOTE — Progress Notes (Signed)
CT A/P performed. No obvious biliary obstruction based on imaging but has worsening LFTs concerning for ongoing bile duct obstruction. [facility]  [facility]. Communicated with Dr Amado CoeGouru  [name], MD Pager: [phone]

## 2017-06-14 NOTE — Discharge Summary (Addendum)
[facility] [place] at [facility]   PATIENT NAME: Randy Nelson    MR#:  [id number]  DATE OF BIRTH:  [birth year]  DATE OF ADMISSION:  06/10/2017 ADMITTING PHYSICIAN: Milagros Loll, MD  DATE OF DISCHARGE: 06/14/17 PRIMARY CARE PHYSICIAN: Manor, Illene Labrador., MD    ADMISSION DIAGNOSIS:  Hyperbilirubinemia [E80.6] Transaminitis [R74.0] Pain of upper abdomen [R10.10] Sepsis, due to unspecified organism (HCC) [A41.9] Non-intractable vomiting with nausea, unspecified vomiting type [R11.2]  DISCHARGE DIAGNOSIS:  Active Problems:   Cholecystitis   Hyperbilirubinemia   Calculus of common duct without obstruction   Elevated bilirubin   Cholangitis due to bile duct calculus with obstruction   Bacteremia   SECONDARY DIAGNOSIS:   Past Medical History:  Diagnosis Date  . Diabetes mellitus without complication (HCC)   . Hypertension     HOSPITAL COURSE:   HPI  Randy Nelson  is a 61 y.o. male with a known history of Hypertension, diabetes presents to the emergency room with 4 days of nausea, vomiting and abdominal pain. Patient was seen in the emergency room on 06/07/2017 diagnosed with viral gastroenteritis and discharged home. Patient continued to have symptoms along with chills, fever and return to the emergency room today. Here patient has been found to have sepsis with fever, leukocytosis and tachycardia. Ultrasound of the right upper quadrant shows concern for cholecystitis. Elevated LFTs. CBD is normal. Discussed with surgery Dr. Tonita Cong. He would like conservative management at this time as patient has mildly elevated troponin. MRCP if patient does not improve. Oxygen saturations were 90% on room air. Patient has no shortness of breath or cough.  #Acute cholangitis with worsening LFTs concerning for ongoing bile duct obstruction No ERCP coverage at [facility] during weekend, gastroenterology has recommended to transfer the patient to  tertiary care center. Patient refused to go to [facility], requesting to transfer the patient only to [facility], patient is under waiting list at [facility] Discussed with [facility] gastroenterologist Dr. Alm Bustard. Patient will be transferred to [facility] when bed is available CT abdomen with no obvious biliary obstruction  #.acute cholecystitis with cholangitis: Patient had chills and fever, right upper quadrant pain and nausea and vomiting for 2 days . Status post ERCP 06/12/2017  2 stones from CBD are removed.  Seen by surgery, patient needs cholecystectomy once sepsis improves . patient can follow up  with surgery as an outpatient for cholecystectomy.  # kidney injury with acute renal failure and sepsis continue IV hydration, kidney function stable.  # slightly elevated troponins likely due to demand ischemia from sepsis.  Echocardiogram showed EF 55%. With normal systolic function. Patient's LEEP monitor need to be removed to get MRCP. Discussed with the on-call cardiology, will consider getting the procedure done on Monday  # . Post ERCP acute pancreatitis  IV fluids, pain management, clear liquid diet   # diabetes mellitus type 2 with hyperglycemia: Better sugar control today, continue Levemir 25 units twice a day, sliding scale insulin with high-dose coverage.   spoke with gastroenterology, general surgery   DISCHARGE CONDITIONS:   FAIR  CONSULTS OBTAINED:  Treatment Team:  Henrene Dodge, MD Toney Reil, MD Lamar Blinks, MD Mick Sell, MD   PROCEDURES  ERCP - 9/6, 2 CBD stones removed  DRUG ALLERGIES:   Allergies  Allergen Reactions  . Morphine And Related Other (See Comments)    Chest tightness  . Sulfa Antibiotics Other (See Comments)    Family  history of allergy, mostly rashes. No anaphylaxis reported.    DISCHARGE MEDICATIONS:   Current Discharge Medication List    START taking these medications   Details  acetaminophen  (TYLENOL) 325 MG tablet Take 2 tablets (650 mg total) by mouth every 6 (six) hours as needed for mild pain (or Fever >/= 101).    albuterol (PROVENTIL) (2.5 MG/3ML) 0.083% nebulizer solution Take 3 mLs (2.5 mg total) by nebulization every 2 (two) hours as needed for wheezing. Qty: 75 mL, Refills: 12    Ampicillin-Sulbactam 3 g in sodium chloride 0.9 % 100 mL Inject 3 g into the vein every 6 (six) hours.    !! cyclobenzaprine (FLEXERIL) 5 MG tablet Take 1 tablet (5 mg total) by mouth at bedtime. Qty: 30 tablet, Refills: 0    !! cyclobenzaprine (FLEXERIL) 5 MG tablet Take 1 tablet (5 mg total) by mouth 3 (three) times daily as needed for muscle spasms. Qty: 30 tablet, Refills: 0    enoxaparin (LOVENOX) 40 MG/0.4ML injection Inject 0.4 mLs (40 mg total) into the skin daily. Qty: 0 Syringe    lactated ringers infusion Inject 150 mLs into the vein continuous. Qty: 250 mL, Refills: 0    ondansetron (ZOFRAN) 4 MG tablet Take 1 tablet (4 mg total) by mouth every 6 (six) hours as needed for nausea. Qty: 20 tablet, Refills: 0    ondansetron (ZOFRAN) 4 MG/2ML SOLN injection Inject 2 mLs (4 mg total) into the vein every 6 (six) hours as needed for nausea. Qty: 2 mL, Refills: 0    oxyCODONE (OXY IR/ROXICODONE) 5 MG immediate release tablet Take 1 tablet (5 mg total) by mouth every 4 (four) hours as needed for moderate pain. Qty: 30 tablet, Refills: 0     !! - Potential duplicate medications found. Please discuss with provider.    CONTINUE these medications which have NOT CHANGED   Details  amLODipine (NORVASC) 5 MG tablet Take 5 mg by mouth daily.    aspirin EC 81 MG tablet Take 81 mg by mouth daily.    atorvastatin (LIPITOR) 10 MG tablet Take 10 mg by mouth daily.    chlorthalidone (HYGROTON) 25 MG tablet Take 25 mg by mouth daily.    LEVEMIR FLEXTOUCH 100 UNIT/ML Pen Inject 50 Units into the skin 2 (two) times daily.    lisinopril (PRINIVIL,ZESTRIL) 40 MG tablet Take 40 mg by mouth  daily.    HUMALOG KWIKPEN 200 UNIT/ML SOPN Inject 5-30 Units into the skin 3 (three) times daily with meals as needed.    ondansetron (ZOFRAN ODT) 4 MG disintegrating tablet Take 1 tablet (4 mg total) by mouth every 8 (eight) hours as needed for nausea or vomiting. Qty: 20 tablet, Refills: 0      STOP taking these medications     metFORMIN (GLUCOPHAGE) 500 MG tablet          DISCHARGE INSTRUCTIONS:   Transfer to [facility] when bed is available  DIET:  Clear liquid diet  DISCHARGE CONDITION:  Fair  ACTIVITY:  Bedrest  OXYGEN:  Home Oxygen: No.   Oxygen Delivery: room air  DISCHARGE LOCATION:    Transfer to [facility] when bed is available  If you experience worsening of your admission symptoms, develop shortness of breath, life threatening emergency, suicidal or homicidal thoughts you must seek medical attention immediately by calling 911 or calling your MD immediately  if symptoms less severe.  You Must read complete instructions/literature along with all the possible adverse  reactions/side effects for all the Medicines you take and that have been prescribed to you. Take any new Medicines after you have completely understood and accpet all the possible adverse reactions/side effects.   Please note  You were cared for by a hospitalist during your hospital stay. If you have any questions about your discharge medications or the care you received while you were in the hospital after you are discharged, you can call the unit and asked to speak with the hospitalist on call if the hospitalist that took care of you is not available. Once you are discharged, your primary care physician will handle any further medical issues. Please note that NO REFILLS for any discharge medications will be authorized once you are discharged, as it is imperative that you return to your primary care physician (or establish a relationship with a primary care physician if you do not have  one) for your aftercare needs so that they can reassess your need for medications and monitor your lab values.     Today  Chief Complaint  Patient presents with  . Fever  . Nausea   Patient is  reporting abdominal pain associated with nausea but tolerating clear liquid diet. Had a bowel movement today.. Patient is frustrated as he is clinically not improving and the gastroenterology has recommended to transfer the patient to tertiary care center for ERCP ASAP which is not available at [facility] through the weekend. Patient wants to go to only [facility] and refused to go to [facility]  ROS:  CONSTITUTIONAL: Denies fevers, chills. Denies any fatigue, weakness.  EYES: Denies blurry vision, double vision, eye pain. EARS, NOSE, THROAT: Denies tinnitus, ear pain, hearing loss. RESPIRATORY: Denies cough, wheeze, shortness of breath.  CARDIOVASCULAR: Denies chest pain, palpitations, edema.  GASTROINTESTINAL: Reporting nausea and epigastric abdominal pain but denies any diarrhea or hematemesis  GENITOURINARY: Denies dysuria, hematuria. ENDOCRINE: Denies nocturia or thyroid problems. HEMATOLOGIC AND LYMPHATIC: Denies easy bruising or bleeding. SKIN: Denies rash or lesion. MUSCULOSKELETAL: Denies pain in neck, back, shoulder, knees, hips or arthritic symptoms.  NEUROLOGIC: Denies paralysis, paresthesias.  PSYCHIATRIC: Denies anxiety or depressive symptoms.   VITAL SIGNS:  Blood pressure (!) 177/64, pulse 70, temperature 98.7 F (37.1 C), temperature source Oral, resp. rate 20, height  (1.905 m), weight 113.6 kg (250 lb 8 oz), SpO2 97 %.  I/O:    Intake/Output Summary (Last 24 hours) at 06/14/17 1559 Last data filed at 06/14/17 1500  Gross per 24 hour  Intake          3846.67 ml  Output             1050 ml  Net          2796.67 ml    PHYSICAL EXAMINATION:  GENERAL:  61 y.o.-year-old patient lying in the bed with no acute distress.  EYES:  Pupils equal, round, reactive to light and accommodation. No scleral icterus. Extraocular muscles intact.  HEENT: Head atraumatic, normocephalic. Oropharynx and nasopharynx clear.  NECK:  Supple, no jugular venous distention. No thyroid enlargement, no tenderness.  LUNGS: Normal breath sounds bilaterally, no wheezing, rales,rhonchi or crepitation. No use of accessory muscles of respiration.  CARDIOVASCULAR: S1, S2 normal. No murmurs, rubs, or gallops.  ABDOMEN: Soft, epigastric area is tender. No rebound tenderness Bowel sounds present.  EXTREMITIES: No pedal edema, cyanosis, or clubbing.  NEUROLOGIC: Cranial nerves II through XII are intact. Muscle strength 5/5 in all extremities. Sensation intact. Gait not  checked.  PSYCHIATRIC: The patient is alert and oriented x 3.  SKIN: No obvious rash, lesion, or ulcer.   DATA REVIEW:   CBC  Recent Labs Lab 06/14/17 0444  WBC 11.8*  HGB 9.5*  HCT 27.1*  PLT 239    Chemistries   Recent Labs Lab 06/14/17 1230  NA 136  K 3.3*  CL 105  CO2 23  GLUCOSE 119*  BUN 23*  CREATININE 0.78  CALCIUM 7.5*  MG 2.2  AST 73*  ALT 83*  ALKPHOS 444*  BILITOT 5.8*    Cardiac Enzymes  Recent Labs Lab 06/11/17 0506  TROPONINI 0.11*    Microbiology Results  Results for orders placed or performed during the hospital encounter of 06/10/17  Urine culture     Status: Abnormal   Collection Time: 06/10/17  3:53 PM  Result Value Ref Range Status   Specimen Description URINE, RANDOM  Final   Special Requests NONE  Final   Culture (A)  Final    <10,000 COLONIES/mL INSIGNIFICANT GROWTH Performed at [facility], [address] [place], [place] [address]    Report Status 06/12/2017 FINAL  Final  Blood Culture (routine x 2)     Status: Abnormal   Collection Time: 06/10/17  4:14 PM  Result Value Ref Range Status   Specimen Description BLOOD BLOOD RIGHT HAND  Final   Special Requests   Final    BOTTLES DRAWN AEROBIC AND ANAEROBIC Blood  Culture results may not be optimal due to an excessive volume of blood received in culture bottles   Culture  Setup Time   Final    GRAM NEGATIVE RODS IN BOTH AEROBIC AND ANAEROBIC BOTTLES CRITICAL RESULT CALLED TO, READ BACK BY AND VERIFIED WITH: KAREN HAYES AT 0743 ON 06/11/17 [facility].    Culture KLEBSIELLA PNEUMONIAE (A)  Final   Report Status 06/13/2017 FINAL  Final   Organism ID, Bacteria KLEBSIELLA PNEUMONIAE  Final      Susceptibility   Klebsiella pneumoniae - MIC*    AMPICILLIN >=32 RESISTANT Resistant     CEFAZOLIN <=4 SENSITIVE Sensitive     CEFEPIME <=1 SENSITIVE Sensitive     CEFTAZIDIME <=1 SENSITIVE Sensitive     CEFTRIAXONE <=1 SENSITIVE Sensitive     CIPROFLOXACIN <=0.25 SENSITIVE Sensitive     GENTAMICIN <=1 SENSITIVE Sensitive     IMIPENEM <=0.25 SENSITIVE Sensitive     TRIMETH/SULFA <=20 SENSITIVE Sensitive     AMPICILLIN/SULBACTAM 4 SENSITIVE Sensitive     PIP/TAZO <=4 SENSITIVE Sensitive     Extended ESBL NEGATIVE Sensitive     * KLEBSIELLA PNEUMONIAE  Blood Culture (routine x 2)     Status: Abnormal   Collection Time: 06/10/17  4:14 PM  Result Value Ref Range Status   Specimen Description BLOOD BLOOD LEFT HAND  Final   Special Requests   Final    BOTTLES DRAWN AEROBIC AND ANAEROBIC Blood Culture results may not be optimal due to an excessive volume of blood received in culture bottles   Culture  Setup Time   Final    GRAM NEGATIVE RODS IN BOTH AEROBIC AND ANAEROBIC BOTTLES CRITICAL VALUE NOTED.  VALUE IS CONSISTENT WITH PREVIOUSLY REPORTED AND CALLED VALUE.    Culture (A)  Final    KLEBSIELLA PNEUMONIAE SUSCEPTIBILITIES PERFORMED ON PREVIOUS CULTURE WITHIN THE LAST 5 DAYS. Performed at [facility], [address], [place], [place] [address]    Report Status 06/13/2017 FINAL  Final  Blood Culture ID Panel (Reflexed)  Status: Abnormal   Collection Time: 06/10/17  4:14 PM  Result Value Ref Range Status   Enterococcus species NOT DETECTED NOT  DETECTED Final   Listeria monocytogenes NOT DETECTED NOT DETECTED Final   Staphylococcus species NOT DETECTED NOT DETECTED Final   Staphylococcus aureus NOT DETECTED NOT DETECTED Final   Streptococcus species NOT DETECTED NOT DETECTED Final   Streptococcus agalactiae NOT DETECTED NOT DETECTED Final   Streptococcus pneumoniae NOT DETECTED NOT DETECTED Final   Streptococcus pyogenes NOT DETECTED NOT DETECTED Final   Acinetobacter baumannii NOT DETECTED NOT DETECTED Final   Enterobacteriaceae species DETECTED (A) NOT DETECTED Final    Comment: Enterobacteriaceae represent a large family of gram-negative bacteria, not a single organism. CRITICAL RESULT CALLED TO, READ BACK BY AND VERIFIED WITH: KAREN HAYES AT 0743 ON 06/11/17 [facility].    Enterobacter cloacae complex NOT DETECTED NOT DETECTED Final   Escherichia coli NOT DETECTED NOT DETECTED Final   Klebsiella oxytoca NOT DETECTED NOT DETECTED Final   Klebsiella pneumoniae DETECTED (A) NOT DETECTED Final    Comment: CRITICAL RESULT CALLED TO, READ BACK BY AND VERIFIED WITH: KAREN HAYES AT 0743 ON 06/11/17 [facility].    Proteus species NOT DETECTED NOT DETECTED Final   Serratia marcescens NOT DETECTED NOT DETECTED Final   Carbapenem resistance NOT DETECTED NOT DETECTED Final   Haemophilus influenzae NOT DETECTED NOT DETECTED Final   Neisseria meningitidis NOT DETECTED NOT DETECTED Final   Pseudomonas aeruginosa NOT DETECTED NOT DETECTED Final   Candida albicans NOT DETECTED NOT DETECTED Final   Candida glabrata NOT DETECTED NOT DETECTED Final   Candida krusei NOT DETECTED NOT DETECTED Final   Candida parapsilosis NOT DETECTED NOT DETECTED Final   Candida tropicalis NOT DETECTED NOT DETECTED Final  CULTURE, BLOOD (ROUTINE X 2) w Reflex to ID Panel     Status: None (Preliminary result)   Collection Time: 06/13/17  1:40 PM  Result Value Ref Range Status   Specimen Description BLOOD RIGHT ASSIST CONTROL  Final   Special Requests   Final    BOTTLES  DRAWN AEROBIC AND ANAEROBIC Blood Culture results may not be optimal due to an inadequate volume of blood received in culture bottles   Culture NO GROWTH < 24 HOURS  Final   Report Status PENDING  Incomplete  CULTURE, BLOOD (ROUTINE X 2) w Reflex to ID Panel     Status: None (Preliminary result)   Collection Time: 06/13/17  1:49 PM  Result Value Ref Range Status   Specimen Description BLOOD RIGHT WRIST  Final   Special Requests   Final    BOTTLES DRAWN AEROBIC AND ANAEROBIC Blood Culture results may not be optimal due to an excessive volume of blood received in culture bottles   Culture NO GROWTH < 24 HOURS  Final   Report Status PENDING  Incomplete    RADIOLOGY:  Dg Chest 2 View  Result Date: 06/10/2017 CLINICAL DATA:  Fever, chills, and nausea and vomiting for the past 4 5 days. No shortness of breath or chest pain. History of diabetes and hypertension. Nonsmoker. EXAM: CHEST  2 VIEW COMPARISON:  None in PACs FINDINGS: The lungs are adequately inflated. The interstitial markings are slightly increased. The heart is top-normal in size. The pulmonary vascularity is not engorged. The mediastinum is normal in width. There is mild prominence of the perihilar lung markings on the left. There is a cardiac rhythm implantable recording device present. The bony thorax exhibits no acute abnormality. IMPRESSION: Mild interstitial prominence suggests bronchitic  change. There is mild central pulmonary vascular prominence however. This could reflect low-grade CHF in the appropriate setting. A follow-up PA and lateral chest x-ray with deep inspiratory effort would be useful. Electronically Signed   By: David  [place] M.D.   On: 06/10/2017 17:13   Ct Abdomen Pelvis W Contrast  Result Date: 06/14/2017 CLINICAL DATA:  Generalized abdominal pain and distention. EXAM: CT ABDOMEN AND PELVIS WITH CONTRAST TECHNIQUE: Multidetector CT imaging of the abdomen and pelvis was performed using the standard protocol following  bolus administration of intravenous contrast. CONTRAST:  ISOVUE-300 IOPAMIDOL (ISOVUE-300) INJECTION 61% COMPARISON:  Ultrasound of June 10, 2017 FINDINGS: Lower chest: Mild bilateral pleural effusions are noted. Hepatobiliary: The liver appears normal. There is noted mild wall thickening and surrounding inflammatory changes involving the gallbladder concerning for possible cholecystitis. No definite gallstones are noted. The gallbladder is mildly dilated. Pancreas: Unremarkable. No pancreatic ductal dilatation or surrounding inflammatory changes. Spleen: Normal in size without focal abnormality. Adrenals/Urinary Tract: Adrenal glands and kidneys appear normal. No renal calculi are noted. The right ureter appears normal. 4 mm calculus is noted at the left ureterovesical junction which results in mild distal left ureteral dilatation. Urinary bladder is otherwise unremarkable. Stomach/Bowel: Stomach is within normal limits. Appendix appears normal. No evidence of bowel wall thickening, distention, or inflammatory changes. Vascular/Lymphatic: Aortic atherosclerosis. No enlarged abdominal or pelvic lymph nodes. Reproductive: Prostate is unremarkable. Other: No abdominal wall hernia or abnormality. No abdominopelvic ascites. Musculoskeletal: No acute or significant osseous findings. IMPRESSION: Mild bilateral pleural effusions. Dilated gallbladder is noted with mild wall thickening and surrounding inflammatory changes concerning for possible cholecystitis. No cholelithiasis is noted. HIDA scan may be performed for further evaluation. Aortic atherosclerosis. Mild distal left ureteral dilatation is noted, most likely due to 4 mm calculus at the left ureterovesical junction. No hydronephrosis is noted. Electronically Signed   By: Lupita Raider, M.D.   On: 06/14/2017 12:29   Dg C-arm 1-60 Min-no Report  Result Date: 06/12/2017 Fluoroscopy was utilized by the requesting physician.  No radiographic  interpretation.   US Abdomen Limited Ruq  Result Date: 06/10/2017 CLINICAL DATA:  Right upper quadrant pain with nausea, vomiting, and fever EXAM: ULTRASOUND ABDOMEN LIMITED RIGHT UPPER QUADRANT COMPARISON:  None. FINDINGS: Gallbladder: Gallbladder appears mildly distended. Within the gallbladder, there are small echogenic foci which move and shadow consistent with cholelithiasis. There is also mild sludge in the gallbladder. Gallbladder wall appears somewhat thickened in portions, particularly anteriorly. There is no pericholecystic fluid. No sonographic Murphy sign noted by sonographer. Common bile duct: Diameter: 4 mm. No intrahepatic or extrahepatic biliary duct dilatation. Liver: No focal lesion identified. Within normal limits in parenchymal echogenicity. Portal vein is patent on color Doppler imaging with normal direction of blood flow towards the liver. IMPRESSION: Gallbladder is distended. Within the gallbladder, there are small gallstones and mild sludge. Areas of gallbladder wall appear mildly thickened but not edematous. This overall appearance is concerning for cholecystitis. It may be prudent to consider nuclear medicine hepatobiliary imaging study to assess cystic duct patency in this circumstance. Study otherwise unremarkable. Electronically Signed   By: Bretta Bang III M.D.   On: 06/10/2017 18:14    EKG:   Orders placed or performed during the hospital encounter of 06/10/17  . ED EKG 12-Lead  . ED EKG  . ED EKG 12-Lead  . ED EKG      Management plans discussed with the patient, family and they are in agreement.  CODE STATUS:  Code Status Orders        Start     Ordered   06/10/17 2001  Full code  Continuous     06/10/17 2002    Code Status History    Date Active Date Inactive Code Status Order ID Comments User Context   This patient has a current code status but no historical code status.    Advance Directive Documentation     Most Recent Value  Type of  Advance Directive  Healthcare Power of Attorney, Living will  Pre-existing out of facility DNR order (yellow form or pink MOST form)  -  "MOST" Form in Place?  -      TOTAL TIME TAKING CARE OF THIS PATIENT: 45  minutes.   Note: This dictation was prepared with Dragon dictation along with smaller phrase technology. Any transcriptional errors that result from this process are unintentional.   @  on 06/14/2017 at 3:59 PM  Between 7am to 6pm - Pager - [phone]  After 6pm go to [email or web address] - password [email or web address] [facility]  [place] [facility]  Office  [phone]  CC: Primary care physician; Eulogio Bear., MD

## 2017-06-15 ENCOUNTER — Ambulatory Visit (HOSPITAL_COMMUNITY)
Admission: AD | Admit: 2017-06-15 | Discharge: 2017-06-15 | Disposition: A | Discharge status: Home / Self Care | Payer: BLUE CROSS/BLUE SHIELD | Source: Other Acute Inpatient Hospital | Attending: Internal Medicine | Admitting: Internal Medicine

## 2017-06-15 DIAGNOSIS — R7881 Bacteremia: Secondary | ICD-10-CM | POA: Insufficient documentation

## 2017-06-15 DIAGNOSIS — E119 Type 2 diabetes mellitus without complications: Secondary | ICD-10-CM | POA: Insufficient documentation

## 2017-06-15 DIAGNOSIS — I10 Essential (primary) hypertension: Secondary | ICD-10-CM | POA: Insufficient documentation

## 2017-06-15 DIAGNOSIS — K8031 Calculus of bile duct with cholangitis, unspecified, with obstruction: Secondary | ICD-10-CM | POA: Insufficient documentation

## 2017-06-15 LAB — CBC
HCT: 28 % — ABNORMAL LOW (ref 40.0–52.0)
HEMOGLOBIN: 9.6 g/dL — AB (ref 13.0–18.0)
MCH: 30.6 pg (ref 26.0–34.0)
MCHC: 34.4 g/dL (ref 32.0–36.0)
MCV: 88.9 fL (ref 80.0–100.0)
PLATELETS: 268 10*3/uL (ref 150–440)
RBC: 3.15 MIL/uL — AB (ref 4.40–5.90)
RDW: 14.2 % (ref 11.5–14.5)
WBC: 12.6 10*3/uL — AB (ref 3.8–10.6)

## 2017-06-15 LAB — COMPREHENSIVE METABOLIC PANEL
ALK PHOS: 482 U/L — AB (ref 38–126)
ALT: 79 U/L — ABNORMAL HIGH (ref 17–63)
ANION GAP: 8 (ref 5–15)
AST: 78 U/L — ABNORMAL HIGH (ref 15–41)
Albumin: 1.9 g/dL — ABNORMAL LOW (ref 3.5–5.0)
BILIRUBIN TOTAL: 4.6 mg/dL — AB (ref 0.3–1.2)
BUN: 19 mg/dL (ref 6–20)
CALCIUM: 7.8 mg/dL — AB (ref 8.9–10.3)
CO2: 24 mmol/L (ref 22–32)
Chloride: 105 mmol/L (ref 101–111)
Creatinine, Ser: 0.76 mg/dL (ref 0.61–1.24)
GFR calc Af Amer: >60 mL/min (ref >60)
GFR calc non Af Amer: >60 mL/min (ref >60)
Glucose, Bld: 121 mg/dL — ABNORMAL HIGH (ref 65–99)
POTASSIUM: 3.5 mmol/L (ref 3.5–5.1)
SODIUM: 137 mmol/L (ref 135–145)
TOTAL PROTEIN: 5.7 g/dL — AB (ref 6.5–8.1)

## 2017-06-15 LAB — MAGNESIUM: Magnesium: 2.4 mg/dL (ref 1.7–2.4)

## 2017-06-15 LAB — GLUCOSE, CAPILLARY
GLUCOSE-CAPILLARY: 122 mg/dL — AB (ref 65–99)
GLUCOSE-CAPILLARY: 157 mg/dL — AB (ref 65–99)
Glucose-Capillary: 92 mg/dL (ref 65–99)
Glucose-Capillary: 147 mg/dL — ABNORMAL HIGH (ref 65–99)

## 2017-06-15 LAB — LIPASE, BLOOD: LIPASE: 252 U/L — AB (ref 11–51)

## 2017-06-15 MED ORDER — ALUM & MAG HYDROXIDE-SIMETH 200-200-20 MG/5ML PO SUSP
30.0000 mL | Freq: Four times a day (QID) | ORAL | Status: DC | PRN
  Administered 2017-06-15: 30 mL via ORAL
  Filled 2017-06-15: qty 30

## 2017-06-15 MED ORDER — OXYCODONE HCL 5 MG PO TABS
5.0000 mg | ORAL_TABLET | Freq: Once | ORAL | Status: AC
  Administered 2017-06-15: 5 mg via ORAL
  Filled 2017-06-15: qty 1

## 2017-06-15 MED ORDER — POTASSIUM CHLORIDE 10 MEQ/100ML IV SOLN
10.0000 meq | INTRAVENOUS | Status: DC
  Administered 2017-06-15: 10 meq via INTRAVENOUS
  Filled 2017-06-15 (×3): qty 100

## 2017-06-15 MED ORDER — ENSURE ENLIVE PO LIQD
237.0000 mL | Freq: Two times a day (BID) | ORAL | Status: DC
  Administered 2017-06-15: 237 mL via ORAL

## 2017-06-15 NOTE — Progress Notes (Signed)
[facility] [place] at [facility]   PATIENT NAME: Randy ReiningLeslie Harty    MR#:  [id number]  DATE OF BIRTH:  Aug 05, 1964  SUBJECTIVE:  CHIEF COMPLAINT:  Patient is nauseous and reporting abdominal pain 6 out of 10. Feels distended  REVIEW OF SYSTEMS:  CONSTITUTIONAL: No fever, fatigue or weakness.  EYES: No blurred or double vision.  EARS, NOSE, AND THROAT: No tinnitus or ear pain.  RESPIRATORY: No cough, shortness of breath, wheezing or hemoptysis.  CARDIOVASCULAR: No chest pain, orthopnea, edema.  GASTROINTESTINAL: reporting abdominal pain, nausea and abdominal distention. Had one episode of vomiting  GENITOURINARY: No dysuria, hematuria.  ENDOCRINE: No polyuria, nocturia,  HEMATOLOGY: No anemia, easy bruising or bleeding SKIN: No rash or lesion. MUSCULOSKELETAL: No joint pain or arthritis.   NEUROLOGIC: No tingling, numbness, weakness.  PSYCHIATRY: No anxiety or depression.   DRUG ALLERGIES:   Allergies  Allergen Reactions  . Morphine And Related Other (See Comments)    Chest tightness  . Sulfa Antibiotics Other (See Comments)    Family history of allergy, mostly rashes. No anaphylaxis reported.    VITALS:  Blood pressure (!) 177/60, pulse (!) 59, temperature 98.1 F (36.7 C), temperature source Oral, resp. rate 20, height 6\' 3"  (1.905 m), weight 116.8 kg (257 lb 8 oz), SpO2 95 %.  PHYSICAL EXAMINATION:  GENERAL:  61 y.o.-year-old patient lying in the bed with no acute distress.  EYES: Pupils equal, round, reactive to light and accommodation. No scleral icterus. Extraocular muscles intact.  HEENT: Head atraumatic, normocephalic. Oropharynx and nasopharynx clear.  NECK:  Supple, no jugular venous distention. No thyroid enlargement, no tenderness.  LUNGS: Normal breath sounds bilaterally, no wheezing, rales,rhonchi or crepitation. No use of accessory muscles of respiration.  CARDIOVASCULAR: S1, S2 normal. No murmurs, rubs, or gallops.  ABDOMEN: Soft,  epigastric tenderness no rebound tenderness some distention. Bowel sounds present. No organomegaly or mass.  EXTREMITIES: No pedal edema, cyanosis, or clubbing.  NEUROLOGIC: Cranial nerves II through XII are intact. Muscle strength 5/5 in all extremities. Sensation intact. Gait not checked.  PSYCHIATRIC: The patient is alert and oriented x 3.  SKIN: No obvious rash, lesion, or ulcer.    LABORATORY PANEL:   CBC  Recent Labs Lab 06/15/17 0447  WBC 12.6*  HGB 9.6*  HCT 28.0*  PLT 268   ------------------------------------------------------------------------------------------------------------------  Chemistries   Recent Labs Lab 06/15/17 0447  NA 137  K 3.5  CL 105  CO2 24  GLUCOSE 121*  BUN 19  CREATININE 0.76  CALCIUM 7.8*  MG 2.4  AST 78*  ALT 79*  ALKPHOS 482*  BILITOT 4.6*   ------------------------------------------------------------------------------------------------------------------  Cardiac Enzymes  Recent Labs Lab 06/11/17 0506  TROPONINI 0.11*   ------------------------------------------------------------------------------------------------------------------  RADIOLOGY:  Ct Abdomen Pelvis W Contrast  Result Date: 06/14/2017 CLINICAL DATA:  Generalized abdominal pain and distention. EXAM: CT ABDOMEN AND PELVIS WITH CONTRAST TECHNIQUE: Multidetector CT imaging of the abdomen and pelvis was performed using the standard protocol following bolus administration of intravenous contrast. CONTRAST:  125mL ISOVUE-300 IOPAMIDOL (ISOVUE-300) INJECTION 61% COMPARISON:  Ultrasound of June 10, 2017 FINDINGS: Lower chest: Mild bilateral pleural effusions are noted. Hepatobiliary: The liver appears normal. There is noted mild wall thickening and surrounding inflammatory changes involving the gallbladder concerning for possible cholecystitis. No definite gallstones are noted. The gallbladder is mildly dilated. Pancreas: Unremarkable. No pancreatic ductal dilatation or  surrounding inflammatory changes. Spleen: Normal in size without focal abnormality. Adrenals/Urinary Tract: Adrenal glands and kidneys appear normal. No  renal calculi are noted. The right ureter appears normal. 4 mm calculus is noted at the left ureterovesical junction which results in mild distal left ureteral dilatation. Urinary bladder is otherwise unremarkable. Stomach/Bowel: Stomach is within normal limits. Appendix appears normal. No evidence of bowel wall thickening, distention, or inflammatory changes. Vascular/Lymphatic: Aortic atherosclerosis. No enlarged abdominal or pelvic lymph nodes. Reproductive: Prostate is unremarkable. Other: No abdominal wall hernia or abnormality. No abdominopelvic ascites. Musculoskeletal: No acute or significant osseous findings. IMPRESSION: Mild bilateral pleural effusions. Dilated gallbladder is noted with mild wall thickening and surrounding inflammatory changes concerning for possible cholecystitis. No cholelithiasis is noted. HIDA scan may be performed for further evaluation. Aortic atherosclerosis. Mild distal left ureteral dilatation is noted, most likely due to 4 mm calculus at the left ureterovesical junction. No hydronephrosis is noted. Electronically Signed   By: Lupita Raider, M.D.   On: 06/14/2017 12:29    EKG:   Orders placed or performed during the hospital encounter of 06/10/17  . ED EKG 12-Lead  . ED EKG  . ED EKG 12-Lead  . ED EKG    ASSESSMENT AND PLAN:     #Acute cholangitis with worsening LFTs concerning for ongoing bile duct obstruction Total bilirubin is trending down 5.8-4.6 No ERCP coverage at [facility] during weekend, gastroenterology has recommended to transfer the patient to tertiary care center. Patient refused to go to [facility], requesting to transfer the patient only to [facility], patient is under waiting list at [facility] Discussed with [facility] gastroenterologist Dr. Alm Bustard on  06/14/2017 Patient will be transferred to [facility] when bed is available CT abdomen with no obvious biliary obstruction  #.acute cholecystitis with cholangitis: Patient had chills and fever, right upper quadrant pain and nausea and vomiting for 2 days . Status post ERCP 06/12/2017  2 stones from CBD are removed.  Seen by surgery, patient needs cholecystectomy once sepsis improves . patient can follow up  with surgery  for cholecystectomy.  # kidney injury with acute renal failure and sepsis Resolved  continue IV hydration, kidney function stable.  # slightly elevated troponins likely due to demand ischemia from sepsis.  Echocardiogram showed EF 55%. With normal systolic function. Patient's LEEP monitor need to be removed to get MRCP. Discussed with the on-call cardiology, will consider getting the procedure done on Monday  # . Post ERCP acute pancreatitis  Lipase is trending down 776-252 IV fluids, pain management, clear liquid diet  ensure  # diabetes mellitus type 2 with hyperglycemia: Better sugar control today, continue Levemir 25 units twice a day, sliding scale insulin with high-dose coverage.  spoke with  cardio   All the records are reviewed and case discussed with Care Management/Social Workerr. Management plans discussed with the patient, family and they are in agreement.  CODE STATUS: fc   TOTAL TIME TAKING CARE OF THIS PATIENT: 35 minutes.   POSSIBLE D/C IN 1-2DAYS, DEPENDING ON CLINICAL CONDITION.  Note: This dictation was prepared with Dragon dictation along with smaller phrase technology. Any transcriptional errors that result from this process are unintentional.   Ramonita Lab M.D on 06/15/2017 at 2:20 PM  Between 7am to 6pm - Pager - [phone] After 6pm go to [email or web address] - password [email or web address] [facility]  [place] [facility]  Office  [phone]  CC: Primary care physician; Eulogio Bear., MD

## 2017-06-15 NOTE — Progress Notes (Signed)
[name], MD [address]  [place]  [place], [place] [address]  Main: [phone]  Fax: [phone] Pager: [phone]   Randy Nelson is being followed for obstructive jaundice Day 5 of follow up    Subjective: Ongoing epigastric pain, nausea, he had 1 episode of emesis after taking potassium pill He had 1 liquid bowel movement followed by gas yesterday On clear liquid diet Afebrile Awaiting transfer to [facility] Wife bedside  He seen by the cardiologist today and planning to remove the loop recorder.  Objective: Vital signs in last 24 hours: Vitals:   06/14/17 2009 06/15/17 0429 06/15/17 0500 06/15/17 1232  BP: (!) 163/72 (!) 148/60  (!) 177/60  Pulse: 66 62  (!) 59  Resp: 18 20    Temp: 98.2 F (36.8 C) 98.3 F (36.8 C)  98.1 F (36.7 C)  TempSrc: Oral Oral  Oral  SpO2: 96% 95%  95%  Weight:   257 lb 8 oz (116.8 kg)   Height:       Weight change: 7 lb (3.175 kg)  Intake/Output Summary (Last 24 hours) at 06/15/17 1318 Last data filed at 06/15/17 1039  Gross per 24 hour  Intake          3850.34 ml  Output             1700 ml  Net          2150.34 ml     Exam: Heart:: Regular rate and rhythm or S1S2 present Lungs: clear to auscultation Abdomen: soft, moderate epigastric tenderness, normal bowel sounds, distended, tympanic   Lab Results: Hepatic Function Latest Ref Rng & Units 06/15/2017 06/14/2017 06/14/2017  Total Protein 6.5 - 8.1 g/dL 5.7(L) 6.0(L) 6.2(L)  Albumin 3.5 - 5.0 g/dL 1.9(L) 2.0(L) 2.1(L)  AST 15 - 41 U/L 78(H) 73(H) 73(H)  ALT 17 - 63 U/L 79(H) 81(H) 83(H)  Alk Phosphatase 38 - 126 U/L 482(H) 469(H) 444(H)  Total Bilirubin 0.3 - 1.2 mg/dL 4.6(H) 5.8(H) 5.8(H)  Bilirubin, Direct 0.1 - 0.5 mg/dL - - 3.9(H)   Micro Results: Recent Results (from the past 240 hour(s))  Urine culture     Status: Abnormal   Collection Time: 06/10/17  3:53 PM  Result Value Ref Range Status   Specimen Description URINE, RANDOM  Final   Special  Requests NONE  Final   Culture (A)  Final    <10,000 COLONIES/mL INSIGNIFICANT GROWTH Performed at [facility], [address], [place], [place] [address]    Report Status 06/12/2017 FINAL  Final  Blood Culture (routine x 2)     Status: Abnormal   Collection Time: 06/10/17  4:14 PM  Result Value Ref Range Status   Specimen Description BLOOD BLOOD RIGHT HAND  Final   Special Requests   Final    BOTTLES DRAWN AEROBIC AND ANAEROBIC Blood Culture results may not be optimal due to an excessive volume of blood received in culture bottles   Culture  Setup Time   Final    GRAM NEGATIVE RODS IN BOTH AEROBIC AND ANAEROBIC BOTTLES CRITICAL RESULT CALLED TO, READ BACK BY AND VERIFIED WITH: KAREN HAYES AT 4142 ON 06/11/17 [place].    Culture KLEBSIELLA PNEUMONIAE (A)  Final   Report Status 06/13/2017 FINAL  Final   Organism ID, Bacteria KLEBSIELLA PNEUMONIAE  Final      Susceptibility   Klebsiella pneumoniae - MIC*    AMPICILLIN >=32 RESISTANT Resistant     CEFAZOLIN <=4 SENSITIVE Sensitive  CEFEPIME <=1 SENSITIVE Sensitive     CEFTAZIDIME <=1 SENSITIVE Sensitive     CEFTRIAXONE <=1 SENSITIVE Sensitive     CIPROFLOXACIN <=0.25 SENSITIVE Sensitive     GENTAMICIN <=1 SENSITIVE Sensitive     IMIPENEM <=0.25 SENSITIVE Sensitive     TRIMETH/SULFA <=20 SENSITIVE Sensitive     AMPICILLIN/SULBACTAM 4 SENSITIVE Sensitive     PIP/TAZO <=4 SENSITIVE Sensitive     Extended ESBL NEGATIVE Sensitive     * KLEBSIELLA PNEUMONIAE  Blood Culture (routine x 2)     Status: Abnormal   Collection Time: 06/10/17  4:14 PM  Result Value Ref Range Status   Specimen Description BLOOD BLOOD LEFT HAND  Final   Special Requests   Final    BOTTLES DRAWN AEROBIC AND ANAEROBIC Blood Culture results may not be optimal due to an excessive volume of blood received in culture bottles   Culture  Setup Time   Final    GRAM NEGATIVE RODS IN BOTH AEROBIC AND ANAEROBIC BOTTLES CRITICAL VALUE NOTED.  VALUE IS CONSISTENT  WITH PREVIOUSLY REPORTED AND CALLED VALUE.    Culture (A)  Final    KLEBSIELLA PNEUMONIAE SUSCEPTIBILITIES PERFORMED ON PREVIOUS CULTURE WITHIN THE LAST 5 DAYS. Performed at [facility], [place] [address] [place], [place] [address]    Report Status 06/13/2017 FINAL  Final  Blood Culture ID Panel (Reflexed)     Status: Abnormal   Collection Time: 06/10/17  4:14 PM  Result Value Ref Range Status   Enterococcus species NOT DETECTED NOT DETECTED Final   Listeria monocytogenes NOT DETECTED NOT DETECTED Final   Staphylococcus species NOT DETECTED NOT DETECTED Final   Staphylococcus aureus NOT DETECTED NOT DETECTED Final   Streptococcus species NOT DETECTED NOT DETECTED Final   Streptococcus agalactiae NOT DETECTED NOT DETECTED Final   Streptococcus pneumoniae NOT DETECTED NOT DETECTED Final   Streptococcus pyogenes NOT DETECTED NOT DETECTED Final   Acinetobacter baumannii NOT DETECTED NOT DETECTED Final   Enterobacteriaceae species DETECTED (A) NOT DETECTED Final    Comment: Enterobacteriaceae represent a large family of gram-negative bacteria, not a single organism. CRITICAL RESULT CALLED TO, READ BACK BY AND VERIFIED WITH: KAREN HAYES AT 4742 ON 06/11/17 [place].    Enterobacter cloacae complex NOT DETECTED NOT DETECTED Final   Escherichia coli NOT DETECTED NOT DETECTED Final   Klebsiella oxytoca NOT DETECTED NOT DETECTED Final   Klebsiella pneumoniae DETECTED (A) NOT DETECTED Final    Comment: CRITICAL RESULT CALLED TO, READ BACK BY AND VERIFIED WITH: KAREN HAYES AT 0743 ON 06/11/17 [place].    Proteus species NOT DETECTED NOT DETECTED Final   Serratia marcescens NOT DETECTED NOT DETECTED Final   Carbapenem resistance NOT DETECTED NOT DETECTED Final   Haemophilus influenzae NOT DETECTED NOT DETECTED Final   Neisseria meningitidis NOT DETECTED NOT DETECTED Final   Pseudomonas aeruginosa NOT DETECTED NOT DETECTED Final   Candida albicans NOT DETECTED NOT DETECTED Final   Candida glabrata  NOT DETECTED NOT DETECTED Final   Candida krusei NOT DETECTED NOT DETECTED Final   Candida parapsilosis NOT DETECTED NOT DETECTED Final   Candida tropicalis NOT DETECTED NOT DETECTED Final  CULTURE, BLOOD (ROUTINE X 2) w Reflex to ID Panel     Status: None (Preliminary result)   Collection Time: 06/13/17  1:40 PM  Result Value Ref Range Status   Specimen Description BLOOD RIGHT ASSIST CONTROL  Final   Special Requests   Final    BOTTLES DRAWN AEROBIC AND ANAEROBIC Blood Culture  results may not be optimal due to an inadequate volume of blood received in culture bottles   Culture NO GROWTH 2 DAYS  Final   Report Status PENDING  Incomplete  CULTURE, BLOOD (ROUTINE X 2) w Reflex to ID Panel     Status: None (Preliminary result)   Collection Time: 06/13/17  1:49 PM  Result Value Ref Range Status   Specimen Description BLOOD RIGHT WRIST  Final   Special Requests   Final    BOTTLES DRAWN AEROBIC AND ANAEROBIC Blood Culture results may not be optimal due to an excessive volume of blood received in culture bottles   Culture NO GROWTH 2 DAYS  Final   Report Status PENDING  Incomplete   Studies/Results: Ct Abdomen Pelvis W Contrast  Result Date: 06/14/2017 CLINICAL DATA:  Generalized abdominal pain and distention. EXAM: CT ABDOMEN AND PELVIS WITH CONTRAST TECHNIQUE: Multidetector CT imaging of the abdomen and pelvis was performed using the standard protocol following bolus administration of intravenous contrast. CONTRAST:  168m ISOVUE-300 IOPAMIDOL (ISOVUE-300) INJECTION 61% COMPARISON:  Ultrasound of June 10, 2017 FINDINGS: Lower chest: Mild bilateral pleural effusions are noted. Hepatobiliary: The liver appears normal. There is noted mild wall thickening and surrounding inflammatory changes involving the gallbladder concerning for possible cholecystitis. No definite gallstones are noted. The gallbladder is mildly dilated. Pancreas: Unremarkable. No pancreatic ductal dilatation or surrounding  inflammatory changes. Spleen: Normal in size without focal abnormality. Adrenals/Urinary Tract: Adrenal glands and kidneys appear normal. No renal calculi are noted. The right ureter appears normal. 4 mm calculus is noted at the left ureterovesical junction which results in mild distal left ureteral dilatation. Urinary bladder is otherwise unremarkable. Stomach/Bowel: Stomach is within normal limits. Appendix appears normal. No evidence of bowel wall thickening, distention, or inflammatory changes. Vascular/Lymphatic: Aortic atherosclerosis. No enlarged abdominal or pelvic lymph nodes. Reproductive: Prostate is unremarkable. Other: No abdominal wall hernia or abnormality. No abdominopelvic ascites. Musculoskeletal: No acute or significant osseous findings. IMPRESSION: Mild bilateral pleural effusions. Dilated gallbladder is noted with mild wall thickening and surrounding inflammatory changes concerning for possible cholecystitis. No cholelithiasis is noted. HIDA scan may be performed for further evaluation. Aortic atherosclerosis. Mild distal left ureteral dilatation is noted, most likely due to 4 mm calculus at the left ureterovesical junction. No hydronephrosis is noted. Electronically Signed   By: JMarijo Conception M.D.   On: 06/14/2017 12:29   Medications: I have reviewed the patient's current medications. Scheduled Meds: . cyclobenzaprine  5 mg Oral QHS  . enoxaparin (LOVENOX) injection  40 mg Subcutaneous Q24H  . feeding supplement (ENSURE ENLIVE)  237 mL Oral BID BM  . insulin aspart  0-20 Units Subcutaneous Q4H  . insulin detemir  25 Units Subcutaneous BID  . lisinopril  40 mg Oral Daily  . polyethylene glycol  17 g Oral Daily  . sodium chloride flush  3 mL Intravenous Q12H   Continuous Infusions: . ampicillin-sulbactam (UNASYN) IV Stopped (06/15/17 1309)  . lactated ringers 150 mL/hr at 06/15/17 0623  . potassium chloride 10 mEq (06/15/17 1316)  . sodium chloride     PRN  Meds:.acetaminophen **OR** acetaminophen, albuterol, alum & mag hydroxide-simeth, cyclobenzaprine, ondansetron **OR** ondansetron (ZOFRAN) IV, oxyCODONE, polyethylene glycol   Assessment: Active Problems:   Cholecystitis   Hyperbilirubinemia   Calculus of common duct without obstruction   Elevated bilirubin   Cholangitis due to bile duct calculus with obstruction   Bacteremia  LJadarrius Maselliis a 61y.o. y/o male with Diabetes on insulin,  hypertension presents with acute calculus cholecystitis and choledocholithiasis, gall stone pancreatitis, status post-ERCP on 06/12/2017 showed CBD stones, underwent sphincterotomy and removal of two bile duct stones. He also has Klebsiella pneumonia bacteremia. He has persistently elevated Alk Phos and T Bili post ERCP. Could be due to ongoing biliary obstruction from residual CBD stones or ampullary swelling or he may have passed a new stone. He also developed mild ileus from pancreatitis and cholecystitis. His total bilirubin has slightly improved today. Alkaline phosphatase continue to rise. Has been afebrile and no worsening leukocytosis. Had a CT A/P yesterday, there was no evidence of pancreatitis, obvious CBD stone.  Plan: - Clear liquid diet - Start Ensure 2-3 times daily - Antiemetics and pain control - LR 161m/hr - On Unasyn for bacteremia per ID, with cultures have been negative 48 hours - Follow daily LFTs - CT A/P  - General surgery to decide about timing of cholecystectomy - Monitor Potassium and Magnesium, goal K > 4 and Mag > 2 to prevent ileus - [facility]    LOS: 5 days   Debara Kamphuis 06/15/2017, 1:18 PM

## 2017-06-15 NOTE — Progress Notes (Signed)
[facility], pt will be going to room 3239 bed # 2. Care link was called and report given to dispatch. RN [facility] pt[facility], Neila GearSous RN. Discharge paperwork was given to pt and pt.'s wife. Pt and pt.'s wife was notified of what room pt would be going to. IV access clean  and intact and was left in place for transportation with care link. Pt information packet was given to care link dispatch.   Aarian Griffie [place]

## 2017-06-15 NOTE — Progress Notes (Signed)
Pt could not handle IV potassium, asked RN to remove the IV and that he can not do the rest of the IV potassium runs. Prime doctor notified of pt.'s request. New orders to discontinue IV potassium runs. Pt.'s current potassium is 3.5. Will continue to monitor pt.   Randy Nelson [place]

## 2017-06-15 NOTE — Consult Note (Signed)
[facility] CLINIC CARDIOLOGY A [facility] PRACTICE  CARDIOLOGY CONSULT NOTE  Patient ID: Randy Nelson MRN: [id number] DOB/AGE: Aug 06, 1956 61 y.o.  Admit date: 06/10/2017 Referring Physician gouru   Primary Physician   Primary Cardiologist   Reason for Consultation Linq removal  HPI: Pt admitted with chololithiasis and is s/p ERCP. May have retained stone. GI wants to proceed with MRCP. Pt has a Medtornic Linq in place which is listed as MR Conditional. The device is no longer providing data. Will need removal.   Review of Systems  Constitutional: Negative.   HENT: Negative.   Eyes: Negative.   Respiratory: Negative.   Cardiovascular: Negative.   Gastrointestinal: Positive for abdominal pain.  Genitourinary: Negative.   Musculoskeletal: Negative.   Skin: Negative.   Neurological: Negative.   Endo/Heme/Allergies: Negative.   Psychiatric/Behavioral: Negative.     Past Medical History:  Diagnosis Date  . Diabetes mellitus without complication (HCC)   . Hypertension     Family History  Problem Relation Age of Onset  . Diabetes Father   . Diabetes Brother     Social History   Social History  . Marital status: Married    Spouse name: N/A  . Number of children: N/A  . Years of education: N/A   Occupational History  . Not on file.   Social History Main Topics  . Smoking status: Never Smoker  . Smokeless tobacco: Never Used  . Alcohol use No  . Drug use: No  . Sexual activity: Not on file   Other Topics Concern  . Not on file   Social History Narrative  . No narrative on file    Past Surgical History:  Procedure Laterality Date  . ERCP N/A 06/12/2017   Procedure: jaundice;  Surgeon: Midge Minium, MD;  Location: [facility] ENDOSCOPY;  Service: Endoscopy;  Laterality: N/A;  . HERNIA REPAIR    . TONSILLECTOMY       Prescriptions Prior to Admission  Medication Sig Dispense Refill Last Dose  . amLODipine (NORVASC) 5 MG tablet Take 5 mg by mouth  daily.   06/10/2017 at 0800  . aspirin EC 81 MG tablet Take 81 mg by mouth daily.   06/10/2017 at 0800  . atorvastatin (LIPITOR) 10 MG tablet Take 10 mg by mouth daily.   06/10/2017 at 0800  . chlorthalidone (HYGROTON) 25 MG tablet Take 25 mg by mouth daily.   06/10/2017 at 0800  . LEVEMIR FLEXTOUCH 100 UNIT/ML Pen Inject 50 Units into the skin 2 (two) times daily.   06/10/2017 at 0800  . lisinopril (PRINIVIL,ZESTRIL) 40 MG tablet Take 40 mg by mouth daily.   06/10/2017 at 0800  . metFORMIN (GLUCOPHAGE) 500 MG tablet Take 1,000 mg by mouth 2 (two) times daily.   06/10/2017 at 0800  . HUMALOG KWIKPEN 200 UNIT/ML SOPN Inject 5-30 Units into the skin 3 (three) times daily with meals as needed.   prn at prn  . ondansetron (ZOFRAN ODT) 4 MG disintegrating tablet Take 1 tablet (4 mg total) by mouth every 8 (eight) hours as needed for nausea or vomiting. 20 tablet 0     Physical Exam: Blood pressure (!) 148/60, pulse 62, temperature 98.3 F (36.8 C), temperature source Oral, resp. rate 20, height  (1.905 m), weight 116.8 kg (257 lb 8 oz), SpO2 95 %.   Wt Readings from Last 1 Encounters:  06/15/17 116.8 kg (257 lb 8 oz)     General appearance: alert and cooperative Resp: clear  to auscultation bilaterally Cardio: regular rate and rhythm GI: abnormal findings:  mild tenderness in the epigastrium Extremities: extremities normal, atraumatic, no cyanosis or edema Neurologic: Grossly normal  Labs:   Lab Results  Component Value Date   WBC 12.6 (H) 06/15/2017   HGB 9.6 (L) 06/15/2017   HCT 28.0 (L) 06/15/2017   MCV 88.9 06/15/2017   PLT 268 06/15/2017    Recent Labs Lab 06/15/17 0447  NA 137  K 3.5  CL 105  CO2 24  BUN 19  CREATININE 0.76  CALCIUM 7.8*  PROT 5.7*  BILITOT 4.6*  ALKPHOS 482*  ALT 79*  AST 78*  GLUCOSE 121*   Lab Results  Component Value Date   TROPONINI 0.11 (HH) 06/11/2017       EKG: nsr  ASSESSMENT AND PLAN:  Pt with a Medtronic Linq implantable loop [place] 2 [place] for an isolate syncopal event. He has had no syncope since the index event. The device was last interogated approximately 5 months ago with absolutely no events per patient. The device is impairing further gi care for possible MRCP. The patient and gastroenterologist desire the device be removed. He is no longer getting any information from the device. Risk and benefits of the procedure discussed.  Signed: Dalia HeadingKenneth A Fath MD, [facility] 06/15/2017, 10:45 AM

## 2017-06-16 ENCOUNTER — Telehealth: Payer: Self-pay

## 2017-06-16 ENCOUNTER — Ambulatory Visit: Admit: 2017-06-16 | Payer: BLUE CROSS/BLUE SHIELD | Admitting: Cardiology

## 2017-06-16 SURGERY — LOOP RECORDER REMOVAL
Anesthesia: Moderate Sedation

## 2017-06-16 NOTE — Telephone Encounter (Signed)
Post-op call made to patient at this time. Spoke with patient. Post-op interview questions below.  1. How are you feeling? better  2. Is your pain controlled? yes  3. What are you doing for the pain? Tylenol  4. Are you having any Nausea or Vomiting? no  5. Are you having any Fever or Chills? Low grade temperature 100.0  6. Are you having any Constipation or Diarrhea? no  7. Is there any Swelling or Bruising you are concerned about? no  8. Do you have any questions or concerns at this time? no   Discussion: Discussed post op appointment with patient at this time.

## 2017-06-18 LAB — CULTURE, BLOOD (ROUTINE X 2)
Culture: NO GROWTH
Culture: NO GROWTH

## 2017-06-23 NOTE — Anesthesia Postprocedure Evaluation (Signed)
Anesthesia Post Note  Patient: Randy Nelson  Procedure(s) Performed: Procedure(s) (LRB): jaundice (N/A)  Patient location during evaluation: PACU Anesthesia Type: General Level of consciousness: awake Pain management: pain level controlled Vital Signs Assessment: post-procedure vital signs reviewed and stable Respiratory status: spontaneous breathing Cardiovascular status: stable Anesthetic complications: no     Last Vitals:  Vitals:   06/15/17 1232 06/15/17 1525  BP: (!) 177/60 (!) 168/60  Pulse: (!) 59 61  Resp:    Temp: 36.7 C 37.1 C  SpO2: 95% 94%    Last Pain:  Vitals:   06/15/17 1525  TempSrc: Oral  PainSc:                  VAN STAVEREN,Tezra Mahr

## 2017-06-26 ENCOUNTER — Inpatient Hospital Stay: Payer: Self-pay | Admitting: Surgery

## 2017-06-27 ENCOUNTER — Inpatient Hospital Stay: Payer: Self-pay | Admitting: Surgery

## 2018-02-10 ENCOUNTER — Telehealth: Payer: Self-pay | Admitting: *Deleted

## 2018-02-10 NOTE — Telephone Encounter (Signed)
Pt stated he is a diabetic with an infected ingrown toenail. Randy Nelson - Scheduler set pt's appt with Dr. Al Corpus tomorrow in the morning. I spoke with pt and instructed to perform espom salt soaks daily and apply neosporin ointment dressing until seen in office. Pt states he is performing soaks.

## 2018-02-11 ENCOUNTER — Encounter: Payer: Self-pay | Admitting: Podiatry

## 2018-02-11 ENCOUNTER — Ambulatory Visit (INDEPENDENT_AMBULATORY_CARE_PROVIDER_SITE_OTHER): Payer: BLUE CROSS/BLUE SHIELD | Admitting: Podiatry

## 2018-02-11 ENCOUNTER — Ambulatory Visit (INDEPENDENT_AMBULATORY_CARE_PROVIDER_SITE_OTHER): Payer: BLUE CROSS/BLUE SHIELD

## 2018-02-11 VITALS — BP 142/74 | HR 67 | Resp 16

## 2018-02-11 DIAGNOSIS — E11610 Type 2 diabetes mellitus with diabetic neuropathic arthropathy: Secondary | ICD-10-CM

## 2018-02-11 DIAGNOSIS — L600 Ingrowing nail: Secondary | ICD-10-CM

## 2018-02-11 MED ORDER — NEOMYCIN-POLYMYXIN-HC 1 % OT SOLN: OTIC | 1 refills | Status: AC

## 2018-02-11 NOTE — Progress Notes (Signed)
Subjective:  Patient ID: Randy Nelson, male    DOB: 11-10-1955,  MRN: [id number] HPI Chief Complaint  Patient presents with  . Toe Pain    Hallux right - red area tip of toe, says nail feels ingrown, nail is thick and discolored, noticed drainage x 1 week, soaking, PCP rx'd doxycyclin and mupirocin cream  . New Patient (Initial Visit)    62 y.o. male presents with the above complaint.   Review of systems: Denies calf pain back pain chest pain shortness of breath denies fever chills nausea vomiting muscle aches and pains.  Denies headache.  Past Medical History:  Diagnosis Date  . Diabetes mellitus without complication (HCC)   . Hypertension    Past Surgical History:  Procedure Laterality Date  . ERCP N/A 06/12/2017   Procedure: jaundice;  Surgeon: Midge Minium, MD;  Location: [facility] ENDOSCOPY;  Service: Endoscopy;  Laterality: N/A;  . HERNIA REPAIR    . TONSILLECTOMY      Current Outpatient Medications:  .  acetaminophen (TYLENOL) 325 MG tablet, Take 2 tablets (650 mg total) by mouth every 6 (six) hours as needed for mild pain (or Fever >/= 101)., Disp: , Rfl:  .  albuterol (PROVENTIL) (2.5 MG/3ML) 0.083% nebulizer solution, Take 3 mLs (2.5 mg total) by nebulization every 2 (two) hours as needed for wheezing., Disp: 75 mL, Rfl: 12 .  amLODipine (NORVASC) 5 MG tablet, Take 5 mg by mouth daily., Disp: , Rfl:  .  aspirin EC 81 MG tablet, Take 81 mg by mouth daily., Disp: , Rfl:  .  atorvastatin (LIPITOR) 10 MG tablet, Take 10 mg by mouth daily., Disp: , Rfl:  .  chlorthalidone (HYGROTON) 25 MG tablet, Take 25 mg by mouth daily., Disp: , Rfl:  .  Continuous Blood Gluc Sensor (FREESTYLE LIBRE 14 DAY SENSOR) MISC, , Disp: , Rfl: 13 .  cyclobenzaprine (FLEXERIL) 5 MG tablet, Take 1 tablet (5 mg total) by mouth 3 (three) times daily as needed for muscle spasms., Disp: 30 tablet, Rfl: 0 .  doxycycline (VIBRAMYCIN) 100 MG capsule, , Disp: , Rfl: 0 .  DUREZOL 0.05 % EMUL, , Disp: ,  Rfl:  .  HUMALOG KWIKPEN 200 UNIT/ML SOPN, Inject 5-30 Units into the skin 3 (three) times daily with meals as needed., Disp: , Rfl:  .  LEVEMIR FLEXTOUCH 100 UNIT/ML Pen, Inject 50 Units into the skin 2 (two) times daily., Disp: , Rfl:  .  lisinopril (PRINIVIL,ZESTRIL) 40 MG tablet, Take 40 mg by mouth daily., Disp: , Rfl:  .  metFORMIN (GLUCOPHAGE) 500 MG tablet, , Disp: , Rfl:  .  Multiple Vitamin (MULTI-VITAMINS) TABS, Take by mouth., Disp: , Rfl:  .  mupirocin ointment (BACTROBAN) 2 %, , Disp: , Rfl: 0 .  naproxen sodium (ALEVE) 220 MG tablet, Take 220 mg by mouth., Disp: , Rfl:  .  NEOMYCIN-POLYMYXIN-HYDROCORTISONE (CORTISPORIN) 1 % SOLN OTIC solution, Apply 1-2 drops to toe BID after soaking, Disp: 10 mL, Rfl: 1 .  omeprazole (PRILOSEC) 10 MG capsule, Take by mouth., Disp: , Rfl:  .  ondansetron (ZOFRAN) 4 MG/2ML SOLN injection, Inject 2 mLs (4 mg total) into the vein every 6 (six) hours as needed for nausea., Disp: 2 mL, Rfl: 0  Allergies  Allergen Reactions  . Morphine Other (See Comments)    Feeling like heart attack  . Lactose     Other reaction(s): Other (See Comments) Gas bloating upset stomach  Got better after GB removed  . Morphine And  Related Other (See Comments)    Chest tightness  . Sulfa Antibiotics Other (See Comments)    Family history of allergy, mostly rashes. No anaphylaxis reported.  . Other Nausea Only    Lactose intolerance, pt no longer has this sensitivity since surgery   Review of Systems Objective:   Vitals:   02/11/18 0921  BP: (!) 142/74  Pulse: 67  Resp: 16    General: Well developed, nourished, in no acute distress, alert and oriented x3   Dermatological: Skin is warm, dry and supple bilateral. Nails x 10 are well maintained; remaining integument appears unremarkable at this time. There are no open sores, no preulcerative lesions, no rash or signs of infection present.  Thick yellow dystrophic hallux nail plate right with purulence from  underneath the nail there is surrounding erythema on the skin.  Does not demonstrate any onychomycosis to the skin.  Vascular: Dorsalis Pedis artery and Posterior Tibial artery pedal pulses are 2/4 bilateral with immedate capillary fill time. Pedal hair growth present. No varicosities and no lower extremity edema present bilateral.   Neruologic: Grossly intact via light touch bilateral. Vibratory intact via tuning fork bilateral. Protective threshold with Semmes Wienstein monofilament intact to all pedal sites bilateral. Patellar and Achilles deep tendon reflexes 2+ bilateral. No Babinski or clonus noted bilateral.   Musculoskeletal: No gross boney pedal deformities bilateral. No pain, crepitus, or limitation noted with foot and ankle range of motion bilateral. Muscular strength 5/5 in all groups tested bilateral.  Gait: Unassisted, Nonantalgic.    Radiographs:  Radiographs bilateral demonstrate Charcot deformity to the left foot which has been repaired and internal fixation removed still demonstrating severe flatfoot deformity with prominent first metatarsal medial cuneiform joint plantarly.  Right foot demonstrates relatively normal osseous architecture without internal fixation.  Assessment & Plan:   Assessment: Ingrown nail with paronychia hallux right.  Plan: Discussed etiology pathology conservative versus surgical therapies.  At this point total nail avulsion was performed after local anesthetic consisting of 3 cc of Marcaine plain and lidocaine plain was injected in a hallux block right.  The nail was avulsed in total after sterile Betadine skin prep I did apply a light coat of phenol to help with bleeding as well as hopefully prevent a thick nail from recurring.  I then redressed it with a dry sterile compressive dressing.  He was provided with oral and written home-going instructions for care and soaking of the toe as well as a prescription of Cortisporin Otic to be applied twice daily  after soaking.  He understands this and is amenable to it.  Follow-up with him in 2 weeks.     Trevan Messman T. [place], [place]

## 2018-02-11 NOTE — Patient Instructions (Signed)

## 2018-02-20 ENCOUNTER — Encounter: Payer: Self-pay | Admitting: *Deleted

## 2018-02-20 ENCOUNTER — Emergency Department
Admission: EM | Admit: 2018-02-20 | Discharge: 2018-02-20 | Disposition: A | Discharge status: Home / Self Care | Payer: Worker's Compensation | Attending: Emergency Medicine | Admitting: Emergency Medicine

## 2018-02-20 ENCOUNTER — Emergency Department: Payer: Worker's Compensation

## 2018-02-20 ENCOUNTER — Other Ambulatory Visit: Payer: Self-pay

## 2018-02-20 DIAGNOSIS — E119 Type 2 diabetes mellitus without complications: Secondary | ICD-10-CM | POA: Insufficient documentation

## 2018-02-20 DIAGNOSIS — Y990 Civilian activity done for income or pay: Secondary | ICD-10-CM | POA: Insufficient documentation

## 2018-02-20 DIAGNOSIS — Z23 Encounter for immunization: Secondary | ICD-10-CM | POA: Insufficient documentation

## 2018-02-20 DIAGNOSIS — W010XXA Fall on same level from slipping, tripping and stumbling without subsequent striking against object, initial encounter: Secondary | ICD-10-CM | POA: Diagnosis not present

## 2018-02-20 DIAGNOSIS — Y929 Unspecified place or not applicable: Secondary | ICD-10-CM | POA: Diagnosis not present

## 2018-02-20 DIAGNOSIS — I10 Essential (primary) hypertension: Secondary | ICD-10-CM | POA: Insufficient documentation

## 2018-02-20 DIAGNOSIS — S0101XA Laceration without foreign body of scalp, initial encounter: Secondary | ICD-10-CM | POA: Diagnosis not present

## 2018-02-20 DIAGNOSIS — Y939 Activity, unspecified: Secondary | ICD-10-CM | POA: Diagnosis not present

## 2018-02-20 DIAGNOSIS — S0990XA Unspecified injury of head, initial encounter: Secondary | ICD-10-CM | POA: Diagnosis present

## 2018-02-20 DIAGNOSIS — S61217A Laceration without foreign body of left little finger without damage to nail, initial encounter: Secondary | ICD-10-CM | POA: Diagnosis not present

## 2018-02-20 DIAGNOSIS — Z79899 Other long term (current) drug therapy: Secondary | ICD-10-CM | POA: Diagnosis not present

## 2018-02-20 MED ORDER — ACETAMINOPHEN 325 MG PO TABS
650.0000 mg | ORAL_TABLET | Freq: Once | ORAL | Status: AC
  Administered 2018-02-20: 650 mg via ORAL
  Filled 2018-02-20: qty 2

## 2018-02-20 MED ORDER — TRAMADOL HCL 50 MG PO TABS: 50.0000 mg | ORAL_TABLET | Freq: Four times a day (QID) | ORAL | 0 refills | Status: AC | PRN

## 2018-02-20 MED ORDER — LIDOCAINE HCL (PF) 1 % IJ SOLN
15.0000 mL | Freq: Once | INTRAMUSCULAR | Status: AC
  Administered 2018-02-20: 15 mL via INTRADERMAL
  Filled 2018-02-20: qty 15

## 2018-02-20 MED ORDER — CEPHALEXIN 500 MG PO CAPS: 500.0000 mg | ORAL_CAPSULE | Freq: Four times a day (QID) | ORAL | 0 refills | Status: AC

## 2018-02-20 MED ORDER — TETANUS-DIPHTH-ACELL PERTUSSIS 5-2.5-18.5 LF-MCG/0.5 IM SUSP
0.5000 mL | Freq: Once | INTRAMUSCULAR | Status: AC
  Administered 2018-02-20: 0.5 mL via INTRAMUSCULAR
  Filled 2018-02-20: qty 0.5

## 2018-02-20 NOTE — ED Provider Notes (Signed)
[facility] Emergency Department Provider Note  ____________________________________________  Time seen: Approximately 6:29 PM  I have reviewed the triage vital signs and the nursing notes.   HISTORY  Chief Complaint Fall and Laceration    HPI Randy Nelson is a 62 y.o. male that presents to the emergency department for evaluation of laceration to forehead and hand after falling at work.  Patient did not lose consciousness. He has had a mild headache since.  He does not take any blood thinners.  Tetanus shot is out of date.    Past Medical History:  Diagnosis Date  . Diabetes mellitus without complication (HCC)   . Hypertension     Patient Active Problem List   Diagnosis Date Noted  . Diabetes mellitus (HCC) 06/15/2017  . Hypertension 06/15/2017  . Cholangitis due to bile duct calculus with obstruction   . Bacteremia   . Calculus of common duct without obstruction   . Elevated bilirubin   . Hyperbilirubinemia   . Cholecystitis 06/10/2017  . Diabetic Charcot foot (HCC) 05/13/2016  . Pain from implanted hardware 01/05/2016  . Other synovitis and tenosynovitis, left ankle and foot 11/15/2015  . 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 Hammer toe of left foot 11/02/2015  . Metatarsalgia of left foot 11/02/2015    Past Surgical History:  Procedure Laterality Date  . ERCP N/A 06/12/2017   Procedure: jaundice;  Surgeon: Midge Minium, MD;  Location[facility] ENDOSCOPY;  Service: Endoscopy;  Laterality: N/A;  . HERNIA REPAIR    . TONSILLECTOMY      Prior to Admission medications   Medication Sig Start Date End Date Taking? Authorizing Provider  acetaminophen (TYLENOL) 325 MG tablet Take 2 tablets (650 mg total) by mouth every 6 (six) hours as needed for mild pain (or Fever >/= 101). 06/14/17   Gouru, Aruna, MD  albuterol (PROVENTIL) (2.5 MG/3ML) 0.083% nebulizer solution Take 3 mLs (2.5 mg total) by  nebulization every 2 (two) hours as needed for wheezing. 06/14/17   Gouru, Deanna Artis, MD  amLODipine (NORVASC) 5 MG tablet Take 5 mg by mouth daily. 03/05/17   [provider]  aspirin EC 81 MG tablet Take 81 mg by mouth daily.    [provider]  atorvastatin (LIPITOR) 10 MG tablet Take 10 mg by mouth daily. 04/14/17   [provider]  cephALEXin (KEFLEX) 500 MG capsule Take 1 capsule (500 mg total) by mouth 4 (four) times daily for 10 days. 02/20/18 03/02/18  Enid Derry, PA-C  chlorthalidone (HYGROTON) 25 MG tablet Take 25 mg by mouth daily. 06/03/17   [provider]  Continuous Blood Gluc Sensor (FREESTYLE LIBRE 14 DAY SENSOR) MISC  02/02/18   [provider]  cyclobenzaprine (FLEXERIL) 5 MG tablet Take 1 tablet (5 mg total) by mouth 3 (three) times daily as needed for muscle spasms. 06/14/17   Ramonita Lab, MD  doxycycline (VIBRAMYCIN) 100 MG capsule  02/09/18   [provider]  DUREZOL 0.05 % EMUL  12/11/17   [provider]  HUMALOG KWIKPEN 200 UNIT/ML SOPN Inject 5-30 Units into the skin 3 (three) times daily with meals as needed. 03/31/17   [provider]  LEVEMIR FLEXTOUCH 100 UNIT/ML Pen Inject 50 Units into the skin 2 (two) times daily. 05/28/17   [provider]  lisinopril (PRINIVIL,ZESTRIL) 40 MG tablet Take 40 mg by mouth daily. 04/07/17   [provider]  metFORMIN (GLUCOPHAGE) 500 MG tablet  12/02/17  [provider]  Multiple Vitamin (MULTI-VITAMINS) TABS Take by mouth.    [provider]  mupirocin ointment (BACTROBAN) 2 %  02/09/18   [provider]  naproxen sodium (ALEVE) 220 MG tablet Take 220 mg by mouth.    [provider]  NEOMYCIN-POLYMYXIN-HYDROCORTISONE (CORTISPORIN) 1 % SOLN OTIC solution Apply 1-2 drops to toe BID after soaking 02/11/18   Hyatt, Max T, DPM  omeprazole (PRILOSEC) 10 MG capsule Take by mouth.    [provider]  ondansetron (ZOFRAN) 4  MG/2ML SOLN injection Inject 2 mLs (4 mg total) into the vein every 6 (six) hours as needed for nausea. 06/14/17   Gouru, Deanna Artis, MD  traMADol (ULTRAM) 50 MG tablet Take 1 tablet (50 mg total) by mouth every 6 (six) hours as needed. 02/20/18 02/20/19  Enid Derry, PA-C    Allergies Morphine; Lactose; Morphine and related; Sulfa antibiotics; and Other  Family History  Problem Relation Age of Onset  . Diabetes Father   . Diabetes Brother     Social History Social History   Tobacco Use  . Smoking status: Never Smoker  . Smokeless tobacco: Never Used  Substance Use Topics  . Alcohol use: No  . Drug use: No     Review of Systems  Gastrointestinal: No abdominal pain.  No nausea, no vomiting.  Musculoskeletal: Positive for finger pain.  Skin: Negative for rash, ecchymosis. Positive for lacerations Neurological: Negative for numbness or tingling. Positive for headache.   ____________________________________________   PHYSICAL EXAM:  VITAL SIGNS: ED Triage Vitals  Enc Vitals Group     BP 02/20/18 1542 (!) 141/53     Pulse Rate 02/20/18 1542 71     Resp 02/20/18 1542 18     Temp 02/20/18 1542 98.8 F (37.1 C)     Temp Source 02/20/18 1542 Oral     SpO2 02/20/18 1542 96 %     Weight 02/20/18 1542 250 lb (113.4 kg)     Height 02/20/18 1542  (1.905 m)     Head Circumference --      Peak Flow --      Pain Score 02/20/18 1609 7     Pain Loc --      Pain Edu? --      Excl. in GC? --      Constitutional: Alert and oriented. Well appearing and in no acute distress. Eyes: Conjunctivae are normal. PERRL. EOMI. Head: 2 cm laceration to left forehead above eyebrow. ENT:      Ears:      Nose: No congestion/rhinnorhea.      Mouth/Throat: Mucous membranes are moist.  Neck: No stridor.  Cardiovascular: Normal rate, regular rhythm.  Good peripheral circulation. Respiratory: Normal respiratory effort without tachypnea or retractions. Lungs CTAB. Good air entry to the bases  with no decreased or absent breath sounds. Musculoskeletal: Full range of motion to all extremities. No gross deformities appreciated. Able to perform resisted flexion and extension of left little finger. Neurologic:  Normal speech and language. No gross focal neurologic deficits are appreciated.  Skin:  Skin is warm, dry.  1.5 cm laceration to proximal left little finger.   ____________________________________________   LABS (all labs ordered are listed, but only abnormal results are displayed)  Labs Reviewed - No data to display ____________________________________________  EKG   ____________________________________________  RADIOLOGY Lexine Baton, personally viewed and evaluated these images (plain radiographs) as part of my medical decision making, as well as reviewing the written report  by the radiologist.  Ct Head Wo Contrast  Result Date: 02/20/2018 CLINICAL DATA:  Pt to ED reporting a fall at work after tripping over a stool. Pt hit head on the floor and has a 1 inch laceration above the left eye. Bleeding under control and lacerations to the left hand where the bleeding is also under control. PT denies having lost consciousness. EXAM: CT HEAD WITHOUT CONTRAST TECHNIQUE: Contiguous axial images were obtained from the base of the skull through the vertex without intravenous contrast. COMPARISON:  None. FINDINGS: Brain: No evidence of acute infarction, hemorrhage, hydrocephalus, extra-axial collection or mass lesion/mass effect. Vascular: No hyperdense vessel or unexpected calcification. Skull: Normal. Negative for fracture or focal lesion. Sinuses/Orbits: Visualized globes and orbits are unremarkable. The visualized sinuses and mastoid air cells are clear. Other: None. IMPRESSION: Normal unenhanced CT scan of the brain. Electronically Signed   By: Amie Portland M.D.   On: 02/20/2018 17:18    ____________________________________________    PROCEDURES  Procedure(s)  performed:    Procedures  LACERATION REPAIR Performed by: Enid Derry  Consent: Verbal consent obtained.  Consent given by: patient  Prepped and Draped in normal sterile fashion  Wound explored: No foreign bodies   Laceration Location: forehead  Laceration Length: 2 cm  Anesthesia: None  Local anesthetic: lidocaine 1% without epinephrine  Anesthetic total: 4 ml  Irrigation method: syringe  Amount of cleaning: normal saline  Skin closure: 5-0 nylon  Number of sutures: 7  Technique: Simple interrupted  Patient tolerance: Patient tolerated the procedure well with no immediate complications.  LACERATION REPAIR Performed by: Enid Derry  Consent: Verbal consent obtained.  Consent given by: patient  Prepped and Draped in normal sterile fashion  Wound explored: No foreign bodies   Laceration Location: finger  Laceration Length: 1.5 cm  Anesthesia: None  Local anesthetic: lidocaine 1% without epinephrine  Anesthetic total: 3 ml  Irrigation method: syringe  Amount of cleaning: normal saline  Skin closure: 4-0 nylon  Number of sutures: 5  Technique: Simple interrupted  Patient tolerance: Patient tolerated the procedure well with no immediate complications.  Medications  lidocaine (PF) (XYLOCAINE) 1 % injection 15 mL (15 mLs Intradermal Given 02/20/18 1728)  Tdap (BOOSTRIX) injection 0.5 mL (0.5 mLs Intramuscular Given 02/20/18 1729)  acetaminophen (TYLENOL) tablet 650 mg (650 mg Oral Given 02/20/18 1725)     ____________________________________________   INITIAL IMPRESSION / ASSESSMENT AND PLAN / ED COURSE  Pertinent labs & imaging results that were available during my care of the patient were reviewed by me and considered in my medical decision making (see chart for details).  Review of the [place] CSRS was performed in accordance of the [facility] prior to dispensing any controlled drugs.   Patient presented to the emergency  department for evaluation after fall.  Vital signs and exam are reassuring.  CT negative for acute abnormalities.  Lacerations were repaired with stitches.  Tetanus shot was updated.  Patient will be discharged home with prescriptions for keflex and tramadol. Patient is to follow up with PCP as directed. Patient is given ED precautions to return to the ED for any worsening or new symptoms.     ____________________________________________  FINAL CLINICAL IMPRESSION(S) / ED DIAGNOSES  Final diagnoses:  Laceration of scalp without foreign body, initial encounter  Laceration of left little finger without foreign body without damage to nail, initial encounter      NEW MEDICATIONS STARTED DURING THIS VISIT:  ED Discharge Orders  Ordered    cephALEXin (KEFLEX) 500 MG capsule  4 times daily     02/20/18 1840    traMADol (ULTRAM) 50 MG tablet  Every 6 hours PRN     02/20/18 1840          This chart was dictated using voice recognition software/Dragon. Despite best efforts to proofread, errors can occur which can change the meaning. Any change was purely unintentional.    Enid Derry, PA-C 02/20/18 2318    Jeanmarie Plant, MD 02/21/18 0001

## 2018-02-20 NOTE — ED Triage Notes (Signed)
Pt to ED reporting a fall at work after tripping over a stool. Pt hit head on the floor and has a 1 inch laceration above the left eye. Bleeding under control and lacerations to the left hand where the bleeding is also under control. PT denies having lost consciousness.   Pt works at [employer].

## 2018-03-11 ENCOUNTER — Ambulatory Visit: Payer: BLUE CROSS/BLUE SHIELD | Admitting: Podiatry

## 2019-11-20 ENCOUNTER — Ambulatory Visit
Admission: EM | Admit: 2019-11-20 | Discharge: 2019-11-20 | Disposition: A | Discharge status: Home / Self Care | Payer: BC Managed Care – PPO | Attending: Family Medicine | Admitting: Family Medicine

## 2019-11-20 ENCOUNTER — Ambulatory Visit (INDEPENDENT_AMBULATORY_CARE_PROVIDER_SITE_OTHER): Payer: BC Managed Care – PPO

## 2019-11-20 ENCOUNTER — Other Ambulatory Visit: Payer: Self-pay

## 2019-11-20 ENCOUNTER — Encounter: Payer: Self-pay | Admitting: Emergency Medicine

## 2019-11-20 DIAGNOSIS — L97529 Non-pressure chronic ulcer of other part of left foot with unspecified severity: Secondary | ICD-10-CM | POA: Insufficient documentation

## 2019-11-20 DIAGNOSIS — E11621 Type 2 diabetes mellitus with foot ulcer: Secondary | ICD-10-CM | POA: Diagnosis present

## 2019-11-20 DIAGNOSIS — M79675 Pain in left toe(s): Secondary | ICD-10-CM | POA: Diagnosis not present

## 2019-11-20 LAB — BASIC METABOLIC PANEL WITH GFR
Anion gap: 8 (ref 5–15)
BUN: 19 mg/dL (ref 8–23)
CO2: 26 mmol/L (ref 22–32)
Calcium: 8.9 mg/dL (ref 8.9–10.3)
Chloride: 104 mmol/L (ref 98–111)
Creatinine, Ser: 0.83 mg/dL (ref 0.61–1.24)
GFR calc Af Amer: >60 mL/min
GFR calc non Af Amer: >60 mL/min
Glucose, Bld: 145 mg/dL — ABNORMAL HIGH (ref 70–99)
Potassium: 4.1 mmol/L (ref 3.5–5.1)
Sodium: 138 mmol/L (ref 135–145)

## 2019-11-20 LAB — CBC WITH DIFFERENTIAL/PLATELET
Abs Immature Granulocytes: 0.03 10*3/uL (ref 0.00–0.07)
Basophils Absolute: 0 10*3/uL (ref 0.0–0.1)
Basophils Relative: 0 %
Eosinophils Absolute: 0.2 10*3/uL (ref 0.0–0.5)
Eosinophils Relative: 2 %
HCT: 39.3 % (ref 39.0–52.0)
Hemoglobin: 13 g/dL (ref 13.0–17.0)
Immature Granulocytes: 0 %
Lymphocytes Relative: 19 %
Lymphs Abs: 1.7 10*3/uL (ref 0.7–4.0)
MCH: 30.5 pg (ref 26.0–34.0)
MCHC: 33.1 g/dL (ref 30.0–36.0)
MCV: 92.3 fL (ref 80.0–100.0)
Monocytes Absolute: 0.7 10*3/uL (ref 0.1–1.0)
Monocytes Relative: 7 %
Neutro Abs: 6.4 10*3/uL (ref 1.7–7.7)
Neutrophils Relative %: 72 %
Platelets: 305 10*3/uL (ref 150–400)
RBC: 4.26 MIL/uL (ref 4.22–5.81)
RDW: 12.9 % (ref 11.5–15.5)
WBC: 9 10*3/uL (ref 4.0–10.5)
nRBC: 0 % (ref 0.0–0.2)

## 2019-11-20 MED ORDER — CLINDAMYCIN HCL 150 MG PO CAPS: 450.0000 mg | ORAL_CAPSULE | Freq: Three times a day (TID) | ORAL | 0 refills | Status: AC

## 2019-11-20 MED ORDER — LEVOFLOXACIN 750 MG PO TABS: 750.0000 mg | ORAL_TABLET | Freq: Every day | ORAL | 0 refills | Status: AC

## 2019-11-20 NOTE — ED Provider Notes (Signed)
[facility]    CSN: [id number] Arrival date & time: 11/20/19  0957      History   Chief Complaint Chief Complaint  Patient presents with  . Toe Pain    left 2nd toe   HPI  64 year old male presents with the above complaint.  Patient reports a 1 week history of left 2nd toe pain, swelling, and redness.  He states that he contacted a teledoc and was placed on Keflex.  Patient reports that he had some initial improvement but has been worsening again for the past 3 to 4 days.  He has finished the Keflex.  He now has 2 open wounds on the toe.  One of the wounds it is draining.  Denies fever.  Denies chills.  He does report pain, 8/10 in severity.  He states that his most recent A1c was 7.0 approximately 2 months ago.  He has known complications from diabetes including Charcot foot.  No other reported symptoms.  No other complaints.  Patient Active Problem List   Diagnosis Date Noted  . Diabetes mellitus (HCC) 06/15/2017  . Hypertension 06/15/2017  . Cholangitis due to bile duct calculus with obstruction   . Bacteremia   . Calculus of common duct without obstruction   . Elevated bilirubin   . Hyperbilirubinemia   . Cholecystitis 06/10/2017  . Diabetic Charcot foot (HCC) 05/13/2016  . Pain from implanted hardware 01/05/2016  . Other synovitis and tenosynovitis, left ankle and foot 11/15/2015  . 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 Hammer toe of left foot 11/02/2015  . Metatarsalgia of left foot 11/02/2015    Past Surgical History:  Procedure Laterality Date  . CHOLECYSTECTOMY    . ERCP N/A 06/12/2017   Procedure: jaundice;  Surgeon: Midge Minium, MD;  Location: [facility] ENDOSCOPY;  Service: Endoscopy;  Laterality: N/A;  . HERNIA REPAIR    . TONSILLECTOMY      Home Medications    Prior to Admission medications   Medication Sig Start Date End Date Taking? Authorizing Provider  amLODipine (NORVASC) 5 MG tablet Take 5 mg by  mouth daily. 03/05/17  Yes [provider]  aspirin EC 81 MG tablet Take 81 mg by mouth daily.   Yes [provider]  atorvastatin (LIPITOR) 10 MG tablet Take 10 mg by mouth daily. 04/14/17  Yes [provider]  chlorthalidone (HYGROTON) 25 MG tablet Take 25 mg by mouth daily. 06/03/17  Yes [provider]  DUREZOL 0.05 % EMUL  12/11/17  Yes [provider]  HUMALOG KWIKPEN 200 UNIT/ML SOPN Inject 5-30 Units into the skin 3 (three) times daily with meals as needed. 03/31/17  Yes [provider]  LEVEMIR FLEXTOUCH 100 UNIT/ML Pen Inject 50 Units into the skin 2 (two) times daily. 05/28/17  Yes [provider]  lisinopril (PRINIVIL,ZESTRIL) 40 MG tablet Take 40 mg by mouth daily. 04/07/17  Yes [provider]  metFORMIN (GLUCOPHAGE) 500 MG tablet  12/02/17  Yes [provider]  Multiple Vitamin (MULTI-VITAMINS) TABS Take by mouth.   Yes [provider]  naproxen sodium (ALEVE) 220 MG tablet Take 220 mg by mouth.   Yes [provider]  acetaminophen (TYLENOL) 325 MG tablet Take 2 tablets (650 mg total) by mouth every 6 (six) hours as needed for mild pain (or Fever >/= 101). 06/14/17   Gouru, Aruna, MD  albuterol (PROVENTIL) (2.5 MG/3ML) 0.083% nebulizer solution Take 3 mLs (2.5 mg total) by nebulization every  2 (two) hours as needed for wheezing. 06/14/17   Nicholes Mango, MD  clindamycin (CLEOCIN) 150 MG capsule Take 3 capsules (450 mg total) by mouth 3 (three) times daily for 7 days. 11/20/19 11/27/19  Coral Spikes, DO  Continuous Blood Gluc Sensor (FREESTYLE LIBRE 14 DAY SENSOR) MISC  02/02/18   [provider]  levofloxacin (LEVAQUIN) 750 MG tablet Take 1 tablet (750 mg total) by mouth daily. 11/20/19   Coral Spikes, DO  mupirocin ointment (BACTROBAN) 2 %  02/09/18   [provider]  NEOMYCIN-POLYMYXIN-HYDROCORTISONE (CORTISPORIN) 1 % SOLN OTIC solution Apply 1-2 drops to toe BID after soaking 02/11/18    Hyatt, Max T, DPM  ondansetron (ZOFRAN) 4 MG/2ML SOLN injection Inject 2 mLs (4 mg total) into the vein every 6 (six) hours as needed for nausea. 06/14/17   Gouru, Illene Silver, MD  omeprazole (PRILOSEC) 10 MG capsule Take by mouth.  11/20/19  [provider]    Family History Family History  Problem Relation Age of Onset  . Diabetes Father   . Diabetes Brother   . Diabetes Mother     Social History Social History   Tobacco Use  . Smoking status: Never Smoker  . Smokeless tobacco: Never Used  Substance Use Topics  . Alcohol use: No  . Drug use: No     Allergies   Morphine, Lactose, Morphine and related, Sulfa antibiotics, and Other   Review of Systems Review of Systems  Constitutional: Negative.   Skin: Positive for wound.   Physical Exam Triage Vital Signs ED Triage Vitals  Enc Vitals Group     BP 11/20/19 1014 (!) 156/62     Pulse Rate 11/20/19 1014 62     Resp 11/20/19 1014 16     Temp 11/20/19 1014 98.1 F (36.7 C)     Temp Source 11/20/19 1014 Oral     SpO2 11/20/19 1014 99 %     Weight 11/20/19 1009 250 lb (113.4 kg)     Height 11/20/19 1009 6\' 3"  (1.905 m)     Head Circumference --      Peak Flow --      Pain Score 11/20/19 1009 8     Pain Loc --      Pain Edu? --      Excl. in [place]? --    Updated Vital Signs BP (!) 156/62 (BP Location: Right Arm)   Pulse 62   Temp 98.1 F (36.7 C) (Oral)   Resp 16   Ht 6\' 3"  (1.905 m)   Wt 113.4 kg   SpO2 99%   BMI 31.25 kg/m   Visual Acuity Right Eye Distance:   Left Eye Distance:   Bilateral Distance:    Right Eye Near:   Left Eye Near:    Bilateral Near:     Physical Exam Vitals and nursing note reviewed.  Constitutional:      General: He is not in acute distress.    Appearance: Normal appearance. He is not ill-appearing.  HENT:     Head: Normocephalic and atraumatic.  Eyes:     General:        Right eye: No discharge.        Left eye: No discharge.     Conjunctiva/sclera: Conjunctivae  normal.  Cardiovascular:     Rate and Rhythm: Normal rate and regular rhythm.     Heart sounds: No murmur.  Pulmonary:     Effort: Pulmonary effort is normal.  Breath sounds: Normal breath sounds. No wheezing, rhonchi or rales.  Musculoskeletal:     Comments: Left foot -second toe with swelling, erythema, and warmth.  There are 2 open wounds one on the plantar aspect and the other where the nail would be.  His nail is now gone.  Drainage noted from the wound on the plantar aspect.  Neurological:     Mental Status: He is alert.  Psychiatric:        Mood and Affect: Mood normal.        Behavior: Behavior normal.    UC Treatments / Results  Labs (all labs ordered are listed, but only abnormal results are displayed) Labs Reviewed  BASIC METABOLIC PANEL - Abnormal; Notable for the following components:      Result Value   Glucose, Bld 145 (*)    All other components within normal limits  CBC WITH DIFFERENTIAL/PLATELET    EKG   Radiology DG Toe 2nd Left  Result Date: 11/20/2019 CLINICAL DATA:  Left second toe pain and swelling for the past week. EXAM: LEFT SECOND TOE COMPARISON:  None. FINDINGS: Soft tissue irregularity involving the distal tip of the second digit without associated subcutaneous emphysema. No discrete areas of osteolysis to suggest osteomyelitis. No radiopaque foreign body joint spaces appear preserved. No definite erosions. IMPRESSION: Suspected soft tissue wound involving the distal tip of the second digit without associated subcutaneous emphysema, radiopaque foreign body, fracture or radiographic evidence of osteomyelitis. Electronically Signed   By: Simonne Come M.D.   On: 11/20/2019 11:05    Procedures Procedures (including critical care time)  Medications Ordered in UC Medications - No data to display  Initial Impression / Assessment and Plan / UC Course  I have reviewed the triage vital signs and the nursing notes.  Pertinent labs & imaging results  that were available during my care of the patient were reviewed by me and considered in my medical decision making (see chart for details).    64 year old male presents with a diabetic foot ulcer/wound.  Patient has underlying diabetes with complication of Charcot foot.  This has led to the development of this wound (thus, this is an exacerbation of his underlying chronic medical problem of Charcot joint).  Complicated wound.  No evidence of osteomyelitis on imaging.  Laboratory studies obtained and were unremarkable.  He has failed outpatient management with Keflex.  Placing on clindamycin and Levaquin.  Advised to see orthopedist early next week.  He was advised to go to the hospital if he worsens.  Final Clinical Impressions(s) / UC Diagnoses   Final diagnoses:  Diabetic ulcer of toe of left foot associated with type 2 diabetes mellitus, unspecified ulcer stage [facility])     Discharge Instructions     Antibiotics as prescribed.  If you worsen, go directly to the hospital.  Call Ortho on Monday.  Take care  Dr. Adriana Simas     ED Prescriptions    Medication Sig Dispense Auth. Provider   levofloxacin (LEVAQUIN) 750 MG tablet Take 1 tablet (750 mg total) by mouth daily. 7 tablet Gustabo Gordillo G, DO   clindamycin (CLEOCIN) 150 MG capsule Take 3 capsules (450 mg total) by mouth 3 (three) times daily for 7 days. 63 capsule Everlene Other G, DO     PDMP not reviewed this encounter.   Tommie Sams, [place] 11/20/19 1144

## 2019-11-20 NOTE — Discharge Instructions (Signed)
Antibiotics as prescribed.  If you worsen, go directly to the hospital.  Call Ortho on Monday.  Take care  Dr. Adriana Simas

## 2019-11-20 NOTE — ED Triage Notes (Signed)
Patient c/o left 2nd toe pain and swelling for a week.  Patient has been on Keflex for the past 7 days.  Patient reports drainage from the site. Patient denies fevers or chills.

## 2020-08-03 IMAGING — CR DG TOE 2ND 2+V*L*
4 series · 4 of 4 positions shown · non-contrast
Comparison: None.

CLINICAL DATA: Left second toe pain and swelling for the past week.

EXAM:
LEFT SECOND TOE

[toe ap]
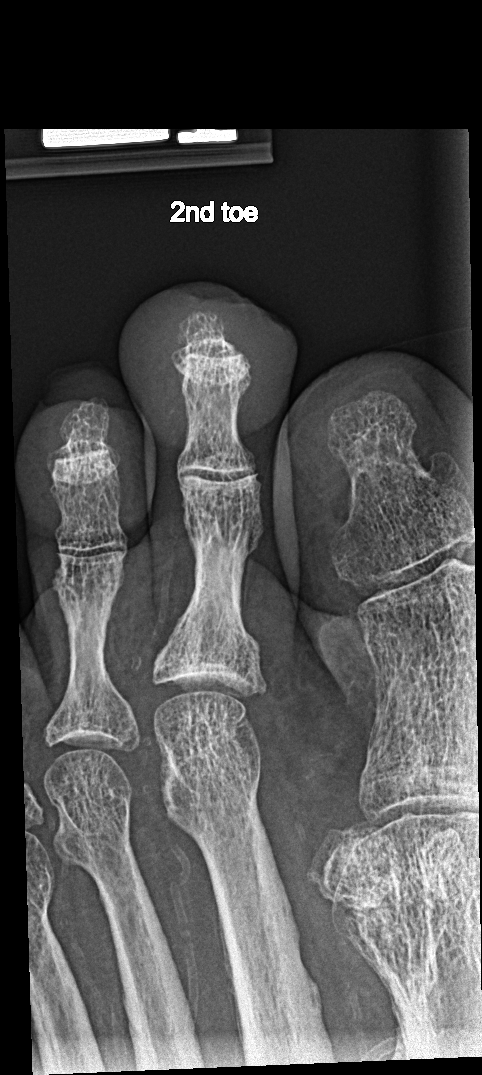

[toe obl]
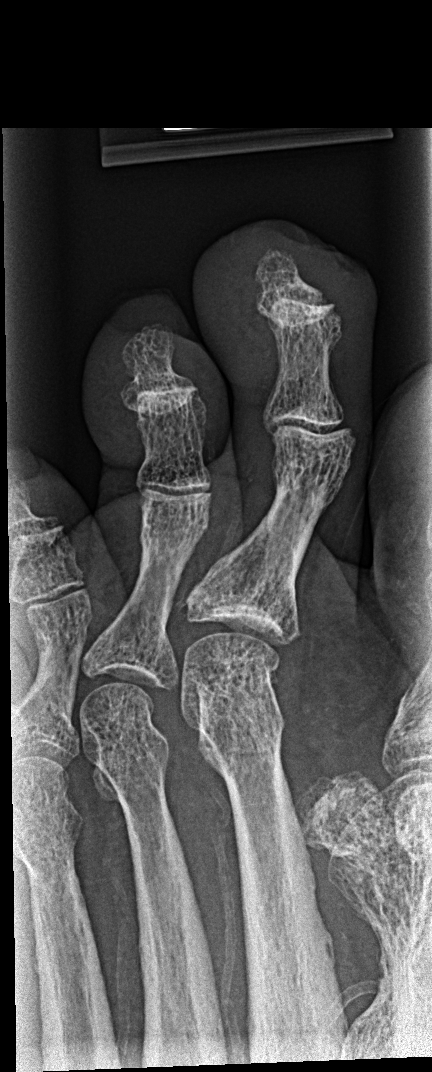

[toe lat (1 of 2)]
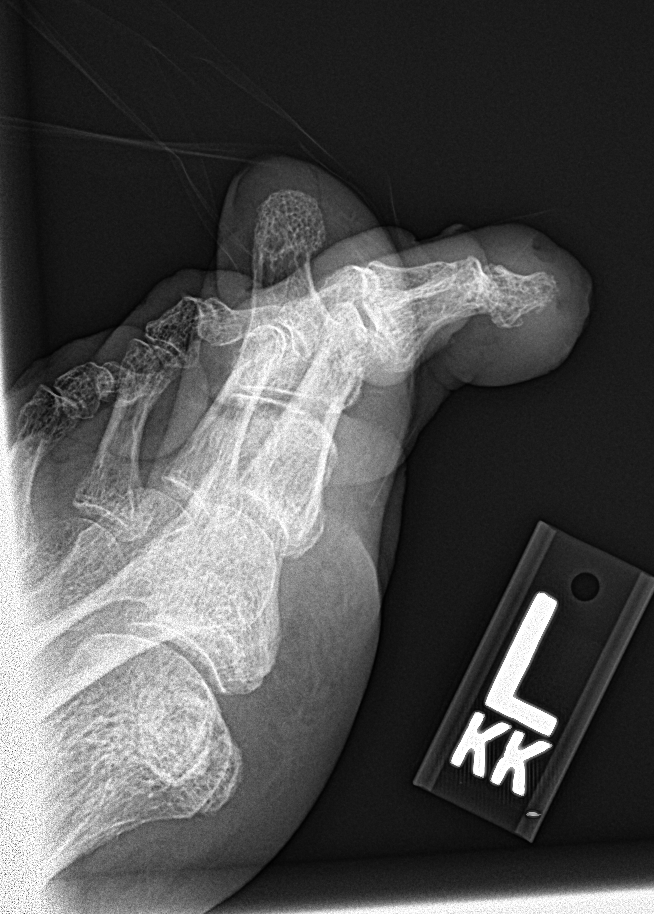

[toe lat (2 of 2)]
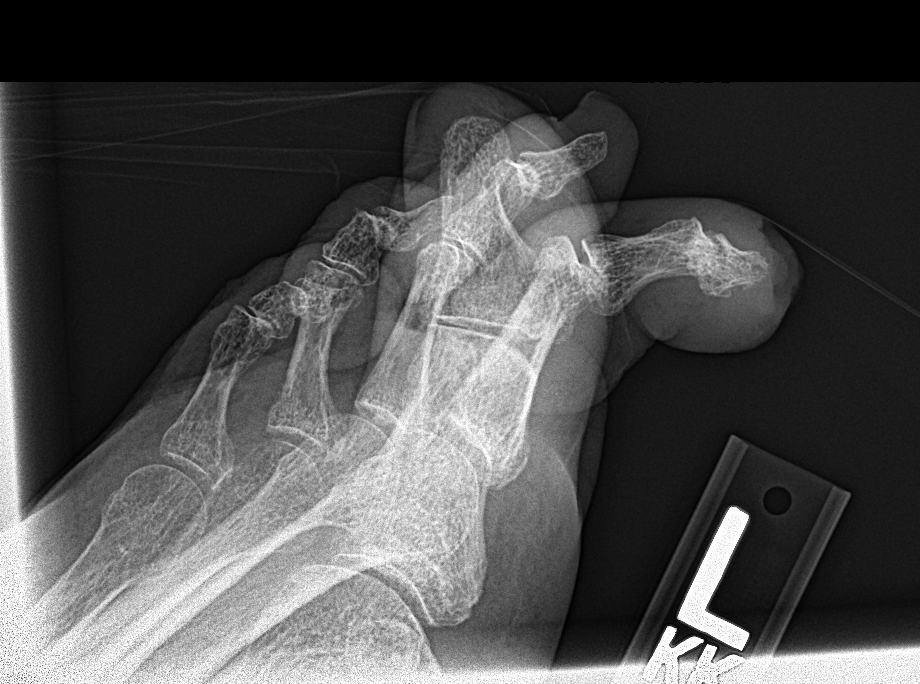

[4 of 4 positions shown; findings below may reference images not displayed]

FINDINGS: Soft tissue irregularity involving the distal tip of the second
digit without associated subcutaneous emphysema. No discrete areas
of osteolysis to suggest osteomyelitis. No radiopaque foreign body
joint spaces appear preserved. No definite erosions.
IMPRESSION: Suspected soft tissue wound involving the distal tip of the second
digit without associated subcutaneous emphysema, radiopaque foreign
body, fracture or radiographic evidence of osteomyelitis.
